# Patient Record
Sex: Female | Born: 1935 | Race: White | Hispanic: No | Smoking: Never smoker
Health system: Southern US, Community
[De-identification: ages and names within clinical notes are randomized; demographics above are authoritative.]

## PROBLEM LIST (undated history)

## (undated) DIAGNOSIS — Z5189 Encounter for other specified aftercare: Secondary | ICD-10-CM

## (undated) DIAGNOSIS — M1990 Unspecified osteoarthritis, unspecified site: Secondary | ICD-10-CM

## (undated) DIAGNOSIS — H3530 Unspecified macular degeneration: Secondary | ICD-10-CM

## (undated) DIAGNOSIS — I10 Essential (primary) hypertension: Secondary | ICD-10-CM

## (undated) DIAGNOSIS — M549 Dorsalgia, unspecified: Secondary | ICD-10-CM

## (undated) DIAGNOSIS — E119 Type 2 diabetes mellitus without complications: Secondary | ICD-10-CM

## (undated) DIAGNOSIS — IMO0002 Reserved for concepts with insufficient information to code with codable children: Secondary | ICD-10-CM

## (undated) DIAGNOSIS — M4800 Spinal stenosis, site unspecified: Secondary | ICD-10-CM

## (undated) DIAGNOSIS — G8929 Other chronic pain: Secondary | ICD-10-CM

## (undated) DIAGNOSIS — N189 Chronic kidney disease, unspecified: Secondary | ICD-10-CM

## (undated) DIAGNOSIS — M329 Systemic lupus erythematosus, unspecified: Secondary | ICD-10-CM

## (undated) DIAGNOSIS — K56609 Unspecified intestinal obstruction, unspecified as to partial versus complete obstruction: Secondary | ICD-10-CM

## (undated) HISTORY — PX: APPENDECTOMY: SHX54

## (undated) HISTORY — PX: TONSILLECTOMY: SUR1361

## (undated) HISTORY — PX: COLOSTOMY: SHX63

## (undated) HISTORY — PX: EYE SURGERY: SHX253

## (undated) HISTORY — PX: BACK SURGERY: SHX140

## (undated) HISTORY — PX: CHOLECYSTECTOMY: SHX55

---

## 2005-02-01 ENCOUNTER — Encounter: Admission: RE | Admit: 2005-02-01 | Discharge: 2005-02-01 | Payer: Self-pay | Admitting: Internal Medicine

## 2005-07-20 ENCOUNTER — Encounter: Admission: RE | Admit: 2005-07-20 | Discharge: 2005-10-18 | Payer: Self-pay | Admitting: Internal Medicine

## 2006-04-11 ENCOUNTER — Ambulatory Visit (HOSPITAL_COMMUNITY): Admission: RE | Admit: 2006-04-11 | Discharge: 2006-04-11 | Payer: Self-pay | Admitting: Internal Medicine

## 2006-05-05 ENCOUNTER — Encounter: Admission: RE | Admit: 2006-05-05 | Discharge: 2006-05-05 | Payer: Self-pay | Admitting: Internal Medicine

## 2008-01-26 ENCOUNTER — Emergency Department (HOSPITAL_COMMUNITY): Admission: EM | Admit: 2008-01-26 | Discharge: 2008-01-26 | Payer: Self-pay | Admitting: Emergency Medicine

## 2008-04-23 ENCOUNTER — Encounter: Admission: RE | Admit: 2008-04-23 | Discharge: 2008-04-23 | Payer: Self-pay | Admitting: Internal Medicine

## 2010-12-19 ENCOUNTER — Encounter: Payer: Self-pay | Admitting: Internal Medicine

## 2011-04-23 ENCOUNTER — Emergency Department (HOSPITAL_BASED_OUTPATIENT_CLINIC_OR_DEPARTMENT_OTHER)
Admission: EM | Admit: 2011-04-23 | Discharge: 2011-04-24 | Disposition: A | Discharge status: Home / Self Care | Payer: Medicare Other | Attending: Emergency Medicine | Admitting: Emergency Medicine

## 2011-04-23 DIAGNOSIS — E119 Type 2 diabetes mellitus without complications: Secondary | ICD-10-CM | POA: Insufficient documentation

## 2011-04-23 DIAGNOSIS — Z79899 Other long term (current) drug therapy: Secondary | ICD-10-CM | POA: Insufficient documentation

## 2011-04-23 DIAGNOSIS — H8110 Benign paroxysmal vertigo, unspecified ear: Secondary | ICD-10-CM | POA: Insufficient documentation

## 2011-04-23 DIAGNOSIS — I10 Essential (primary) hypertension: Secondary | ICD-10-CM | POA: Insufficient documentation

## 2011-08-19 LAB — POCT I-STAT CREATININE: Creatinine, Ser: 1.7 — ABNORMAL HIGH

## 2011-08-19 LAB — DIFFERENTIAL
Basophils Absolute: 0
Basophils Relative: 0
Neutro Abs: 11.5 — ABNORMAL HIGH
Neutrophils Relative %: 96 — ABNORMAL HIGH

## 2011-08-19 LAB — URINE CULTURE: Colony Count: >=100000

## 2011-08-19 LAB — CBC
MCHC: 33.8
RDW: 13.1

## 2011-08-19 LAB — URINALYSIS, ROUTINE W REFLEX MICROSCOPIC
Bilirubin Urine: NEGATIVE
Nitrite: NEGATIVE
Protein, ur: NEGATIVE
Specific Gravity, Urine: 1.025
Urobilinogen, UA: 0.2

## 2011-08-19 LAB — URINE MICROSCOPIC-ADD ON

## 2011-08-19 LAB — I-STAT 8, (EC8 V) (CONVERTED LAB)
Acid-base deficit: 5 — ABNORMAL HIGH
Chloride: 111
Glucose, Bld: 129 — ABNORMAL HIGH
pCO2, Ven: 34.6 — ABNORMAL LOW
pH, Ven: 7.363 — ABNORMAL HIGH

## 2012-03-30 ENCOUNTER — Other Ambulatory Visit: Payer: Self-pay | Admitting: Internal Medicine

## 2012-03-30 DIAGNOSIS — M545 Low back pain: Secondary | ICD-10-CM

## 2012-04-06 ENCOUNTER — Ambulatory Visit
Admission: RE | Admit: 2012-04-06 | Discharge: 2012-04-06 | Disposition: A | Discharge status: Home / Self Care | Payer: PRIVATE HEALTH INSURANCE | Source: Ambulatory Visit | Attending: Internal Medicine | Admitting: Internal Medicine

## 2012-04-06 DIAGNOSIS — M545 Low back pain: Secondary | ICD-10-CM

## 2012-04-10 ENCOUNTER — Other Ambulatory Visit: Payer: PRIVATE HEALTH INSURANCE

## 2013-02-02 ENCOUNTER — Encounter (HOSPITAL_BASED_OUTPATIENT_CLINIC_OR_DEPARTMENT_OTHER): Payer: Self-pay | Admitting: *Deleted

## 2013-02-02 ENCOUNTER — Inpatient Hospital Stay (HOSPITAL_BASED_OUTPATIENT_CLINIC_OR_DEPARTMENT_OTHER)
Admission: EM | Admit: 2013-02-02 | Discharge: 2013-02-07 | DRG: 871 | Disposition: A | Discharge status: Home / Self Care | Payer: Medicare Other | Attending: Internal Medicine | Admitting: Internal Medicine

## 2013-02-02 ENCOUNTER — Emergency Department (HOSPITAL_BASED_OUTPATIENT_CLINIC_OR_DEPARTMENT_OTHER): Payer: Medicare Other

## 2013-02-02 DIAGNOSIS — D72829 Elevated white blood cell count, unspecified: Secondary | ICD-10-CM

## 2013-02-02 DIAGNOSIS — B349 Viral infection, unspecified: Secondary | ICD-10-CM

## 2013-02-02 DIAGNOSIS — R5381 Other malaise: Secondary | ICD-10-CM | POA: Diagnosis present

## 2013-02-02 DIAGNOSIS — I129 Hypertensive chronic kidney disease with stage 1 through stage 4 chronic kidney disease, or unspecified chronic kidney disease: Secondary | ICD-10-CM | POA: Diagnosis present

## 2013-02-02 DIAGNOSIS — D696 Thrombocytopenia, unspecified: Secondary | ICD-10-CM | POA: Diagnosis present

## 2013-02-02 DIAGNOSIS — N183 Chronic kidney disease, stage 3 unspecified: Secondary | ICD-10-CM | POA: Diagnosis present

## 2013-02-02 DIAGNOSIS — E119 Type 2 diabetes mellitus without complications: Secondary | ICD-10-CM | POA: Diagnosis present

## 2013-02-02 DIAGNOSIS — R6521 Severe sepsis with septic shock: Secondary | ICD-10-CM

## 2013-02-02 DIAGNOSIS — I10 Essential (primary) hypertension: Secondary | ICD-10-CM

## 2013-02-02 DIAGNOSIS — R509 Fever, unspecified: Secondary | ICD-10-CM

## 2013-02-02 DIAGNOSIS — I959 Hypotension, unspecified: Secondary | ICD-10-CM

## 2013-02-02 DIAGNOSIS — K5900 Constipation, unspecified: Secondary | ICD-10-CM | POA: Diagnosis present

## 2013-02-02 DIAGNOSIS — R7881 Bacteremia: Secondary | ICD-10-CM | POA: Diagnosis present

## 2013-02-02 DIAGNOSIS — A419 Sepsis, unspecified organism: Principal | ICD-10-CM | POA: Insufficient documentation

## 2013-02-02 DIAGNOSIS — G9340 Encephalopathy, unspecified: Secondary | ICD-10-CM | POA: Diagnosis present

## 2013-02-02 DIAGNOSIS — Z79899 Other long term (current) drug therapy: Secondary | ICD-10-CM

## 2013-02-02 DIAGNOSIS — N390 Urinary tract infection, site not specified: Secondary | ICD-10-CM | POA: Diagnosis present

## 2013-02-02 HISTORY — DX: Type 2 diabetes mellitus without complications: E11.9

## 2013-02-02 HISTORY — DX: Dorsalgia, unspecified: M54.9

## 2013-02-02 HISTORY — DX: Reserved for concepts with insufficient information to code with codable children: IMO0002

## 2013-02-02 HISTORY — DX: Essential (primary) hypertension: I10

## 2013-02-02 HISTORY — DX: Unspecified macular degeneration: H35.30

## 2013-02-02 HISTORY — DX: Encounter for other specified aftercare: Z51.89

## 2013-02-02 HISTORY — DX: Chronic kidney disease, unspecified: N18.9

## 2013-02-02 HISTORY — DX: Other chronic pain: G89.29

## 2013-02-02 HISTORY — DX: Spinal stenosis, site unspecified: M48.00

## 2013-02-02 HISTORY — DX: Systemic lupus erythematosus, unspecified: M32.9

## 2013-02-02 LAB — CBC WITH DIFFERENTIAL/PLATELET
Basophils Absolute: 0 10*3/uL (ref 0.0–0.1)
Eosinophils Absolute: 0 10*3/uL (ref 0.0–0.7)
Eosinophils Relative: 0 % (ref 0–5)
Lymphocytes Relative: 3 % — ABNORMAL LOW (ref 12–46)
Lymphs Abs: 0.6 10*3/uL — ABNORMAL LOW (ref 0.7–4.0)
MCH: 35 pg — ABNORMAL HIGH (ref 26.0–34.0)
MCV: 105 fL — ABNORMAL HIGH (ref 78.0–100.0)
Neutrophils Relative %: 92 % — ABNORMAL HIGH (ref 43–77)
Platelets: 120 10*3/uL — ABNORMAL LOW (ref 150–400)
RBC: 3.2 MIL/uL — ABNORMAL LOW (ref 3.87–5.11)
RDW: 12.6 % (ref 11.5–15.5)
WBC: 17.6 10*3/uL — ABNORMAL HIGH (ref 4.0–10.5)

## 2013-02-02 LAB — URINALYSIS, ROUTINE W REFLEX MICROSCOPIC
Glucose, UA: NEGATIVE mg/dL
Ketones, ur: NEGATIVE mg/dL
Nitrite: NEGATIVE
Specific Gravity, Urine: 1.016 (ref 1.005–1.030)
pH: 6 (ref 5.0–8.0)

## 2013-02-02 LAB — URINE MICROSCOPIC-ADD ON

## 2013-02-02 MED ORDER — SODIUM CHLORIDE 0.9 % IV BOLUS (SEPSIS)
1000.0000 mL | Freq: Once | INTRAVENOUS | Status: AC
  Administered 2013-02-02: 1000 mL via INTRAVENOUS

## 2013-02-02 MED ORDER — DEXTROSE 5 % IV SOLN
1.0000 g | Freq: Once | INTRAVENOUS | Status: AC
  Administered 2013-02-03: 1 g via INTRAVENOUS
  Filled 2013-02-02: qty 10

## 2013-02-02 NOTE — ED Provider Notes (Signed)
History  This chart was scribed for Hilario Quarry, MD, by Candelaria Stagers, ED Scribe. This patient was seen in room MH05/MH05 and the patient's care was started at 10:21 PM   CSN: [id number]  Arrival date & time 02/02/13  2100   First MD Initiated Contact with Patient 02/02/13 2219      Chief Complaint  Patient presents with  . Fatigue  . Chills     The history is provided by the patient and a relative. No language interpreter was used.   Kristen Wallace is a 79 y.o. female who presents to the Emergency Department complaining of fever, chills, and nausea that started earlier today.  Her daughter reports that when she checked on her mother around 7PM she was shivering and experiencing nausea and fever of 101 and reports that earlier today she felt fine.  She denies cough.  Pt reports she has not been eating and drinking normally because she hasn't felt well.   Pt lives alone.  Pt has an ostomy bag in place for bowel blockage.  Her daughter reports she has taken percocet this evening for h/o spinal stenosis.  Pt also has h/o HTN, diabetes, Lupus, and macular degeneration.      Dr. Jamie Kato for Lupus Dr. Dierdre Forth  Dr. Jordan Likes Dr. Allyne Gee for macular degeneration Past Medical History  Diagnosis Date  . Back pain, chronic   . Spinal stenosis   . Blood transfusion without reported diagnosis   . Hypertension   . Diabetes mellitus without complication   . Macular degeneration   . Lupus   . Chronic renal insufficiency     Past Surgical History  Procedure Laterality Date  . Colostomy    . Tonsillectomy    . Appendectomy    . Cholecystectomy    . Eye surgery      No family history on file.  History  Substance Use Topics  . Smoking status: Never Smoker   . Smokeless tobacco: Never Used  . Alcohol Use: No    OB History   Grav Para Term Preterm Abortions TAB SAB Ect Mult Living                  Review of Systems  Constitutional: Positive for fever and chills.  Gastrointestinal:  Positive for nausea and vomiting.  All other systems reviewed and are negative.    Allergies  Betadine  Home Medications   Current Outpatient Rx  Name  Route  Sig  Dispense  Refill  . atenolol (TENORMIN) 25 MG tablet   Oral   Take 12.5 mg by mouth daily.         . Calcium Carbonate Antacid (CALCIUM ANTACID PO)   Oral   Take by mouth.         . co-enzyme Q-10 30 MG capsule   Oral   Take 30 mg by mouth 3 (three) times daily.         Marland Kitchen darifenacin (ENABLEX) 15 MG 24 hr tablet   Oral   Take 15 mg by mouth daily.         . fish oil-omega-3 fatty acids 1000 MG capsule   Oral   Take 2 g by mouth daily.         . hydrochlorothiazide (MICROZIDE) 12.5 MG capsule   Oral   Take 12.5 mg by mouth daily.         . hydroxychloroquine (PLAQUENIL) 200 MG tablet   Oral   Take by mouth daily.         Marland Kitchen  Multiple Vitamin (MULTIVITAMIN) capsule   Oral   Take 1 capsule by mouth daily.         . Multiple Vitamins-Minerals (ICAPS) CAPS   Oral   Take by mouth.         Marland Kitchen omeprazole (PRILOSEC) 20 MG capsule   Oral   Take 20 mg by mouth daily.         . pioglitazone (ACTOS) 30 MG tablet   Oral   Take 30 mg by mouth daily.         Marland Kitchen telmisartan (MICARDIS) 40 MG tablet   Oral   Take 40 mg by mouth daily.           BP 98/55  Pulse 86  Temp(Src) 98.3 F (36.8 C) (Oral)  Resp 20  SpO2 100%  Physical Exam  Nursing note and vitals reviewed. Constitutional: She is oriented to person, place, and time. She appears well-developed and well-nourished. No distress.  HENT:  Head: Normocephalic and atraumatic.  Mucous membranes dry.   Eyes: EOM are normal.  Conjunctiva pale  Neck: Normal range of motion. Neck supple. No tracheal deviation present.  Cardiovascular: Normal rate and regular rhythm.   Pulmonary/Chest: Effort normal. No respiratory distress. She has no wheezes. She has no rales.  Musculoskeletal: Normal range of motion.  Neurological: She is  alert and oriented to person, place, and time.  Skin: Skin is warm and dry. No rash noted.  Psychiatric: She has a normal mood and affect. Her behavior is normal.    ED Course  Procedures   DIAGNOSTIC STUDIES: Oxygen Saturation is 100% on room air, normal by my interpretation.    COORDINATION OF CARE:  10:26 PM Discussed course of care with pt which includes IV fluid, lab work, and chest xray.  Pt understands and agrees.    Labs Reviewed  URINALYSIS, ROUTINE W REFLEX MICROSCOPIC - Abnormal; Notable for the following:    Leukocytes, UA SMALL (*)    All other components within normal limits  URINE MICROSCOPIC-ADD ON - Abnormal; Notable for the following:    Squamous Epithelial / LPF FEW (*)    Bacteria, UA FEW (*)    Casts HYALINE CASTS (*)    All other components within normal limits  URINE CULTURE  CULTURE, BLOOD (ROUTINE X 2)  CULTURE, BLOOD (ROUTINE X 2)  LACTIC ACID, PLASMA  CBC WITH DIFFERENTIAL  COMPREHENSIVE METABOLIC PANEL   Dg Chest 2 View  02/02/2013  *RADIOLOGY REPORT*  Clinical Data: Fever, chills, nausea  CHEST - 2 VIEW  Comparison: Prior chest x-ray 01/26/2008  Findings: No focal airspace consolidation to suggest pneumonia. Mild borderline elevation of the right hemidiaphragm similar to prior.  Chronic interstitial prominence.  Cardiac and mediastinal contours remain within normal limits.  No pleural effusion or pneumothorax.  No acute osseous abnormality.  IMPRESSION: No acute cardiopulmonary disease.  Specifically, negative for pneumonia.   Original Report Authenticated By: Malachy Moan, M.D.      No diagnosis found.    MDM  Elderly female with multiple medical problems including steroid injections of back and on plaquenil with renal insufficiency presents with fever and weakness.  No definite source identified in ed.  Plan admission to [facility] for further evaluation.  Patient with borderline hypotension which responded to fluids and normal  lactate.  1-possible sepsis 2- possible immunocompromise from meds 3 chronic renal insufficiency.  I personally performed the services described in this documentation, which was scribed in my presence. The recorded information has  been reviewed and considered.       Hilario Quarry, MD 02/20/13 1420

## 2013-02-02 NOTE — ED Notes (Signed)
Transported to xray 

## 2013-02-02 NOTE — ED Notes (Signed)
Pt with fever- shaking chills- + nausea, dry heaves in triage

## 2013-02-02 NOTE — ED Notes (Signed)
Pt returned from xray

## 2013-02-03 DIAGNOSIS — D72829 Elevated white blood cell count, unspecified: Secondary | ICD-10-CM

## 2013-02-03 DIAGNOSIS — R509 Fever, unspecified: Secondary | ICD-10-CM

## 2013-02-03 DIAGNOSIS — I959 Hypotension, unspecified: Secondary | ICD-10-CM

## 2013-02-03 LAB — LACTIC ACID, PLASMA: Lactic Acid, Venous: 2.3 mmol/L — ABNORMAL HIGH (ref 0.5–2.2)

## 2013-02-03 LAB — URINE MICROSCOPIC-ADD ON

## 2013-02-03 LAB — URINALYSIS, ROUTINE W REFLEX MICROSCOPIC
Bilirubin Urine: NEGATIVE
Hgb urine dipstick: NEGATIVE
Ketones, ur: NEGATIVE mg/dL
Nitrite: NEGATIVE
Protein, ur: NEGATIVE mg/dL
Specific Gravity, Urine: 1.016 (ref 1.005–1.030)
Urobilinogen, UA: 1 mg/dL (ref 0.0–1.0)

## 2013-02-03 LAB — GLUCOSE, CAPILLARY
Glucose-Capillary: 117 mg/dL — ABNORMAL HIGH (ref 70–99)
Glucose-Capillary: 112 mg/dL — ABNORMAL HIGH (ref 70–99)

## 2013-02-03 LAB — COMPREHENSIVE METABOLIC PANEL
ALT: 21 U/L (ref 0–35)
AST: 45 U/L — ABNORMAL HIGH (ref 0–37)
Alkaline Phosphatase: 63 U/L (ref 39–117)
Calcium: 9.2 mg/dL (ref 8.4–10.5)
Potassium: 4 mEq/L (ref 3.5–5.1)
Sodium: 138 mEq/L (ref 135–145)
Total Protein: 5.8 g/dL — ABNORMAL LOW (ref 6.0–8.3)

## 2013-02-03 MED ORDER — HYDROCODONE-ACETAMINOPHEN 5-325 MG PO TABS
1.0000 | ORAL_TABLET | ORAL | Status: DC | PRN
  Administered 2013-02-03 – 2013-02-04 (×3): 1 via ORAL
  Administered 2013-02-05: 1.5 via ORAL
  Administered 2013-02-05 (×2): 1 via ORAL
  Administered 2013-02-06: 2 via ORAL
  Administered 2013-02-06 – 2013-02-07 (×2): 1 via ORAL
  Filled 2013-02-03 (×3): qty 2
  Filled 2013-02-03 (×3): qty 1
  Filled 2013-02-03: qty 2
  Filled 2013-02-03: qty 1
  Filled 2013-02-03 (×2): qty 2

## 2013-02-03 MED ORDER — OXYCODONE-ACETAMINOPHEN 5-325 MG PO TABS
1.0000 | ORAL_TABLET | Freq: Once | ORAL | Status: AC
  Administered 2013-02-03: 1 via ORAL
  Filled 2013-02-03 (×2): qty 1

## 2013-02-03 MED ORDER — DARIFENACIN HYDROBROMIDE ER 15 MG PO TB24
15.0000 mg | ORAL_TABLET | Freq: Every day | ORAL | Status: DC
  Administered 2013-02-03 – 2013-02-07 (×5): 15 mg via ORAL
  Filled 2013-02-03 (×5): qty 1

## 2013-02-03 MED ORDER — ENOXAPARIN SODIUM 30 MG/0.3ML ~~LOC~~ SOLN: 30.0000 mg | SUBCUTANEOUS | Status: DC

## 2013-02-03 MED ORDER — ATENOLOL 12.5 MG HALF TABLET: 12.5000 mg | ORAL_TABLET | Freq: Every day | ORAL | Status: DC

## 2013-02-03 MED ORDER — HYDROXYCHLOROQUINE SULFATE 200 MG PO TABS
200.0000 mg | ORAL_TABLET | Freq: Once | ORAL | Status: AC
  Administered 2013-02-03: 200 mg via ORAL
  Filled 2013-02-03 (×2): qty 1

## 2013-02-03 MED ORDER — MORPHINE SULFATE 2 MG/ML IJ SOLN: 1.0000 mg | INTRAMUSCULAR | Status: DC | PRN

## 2013-02-03 MED ORDER — VANCOMYCIN HCL 10 G IV SOLR
1250.0000 mg | INTRAVENOUS | Status: DC
  Administered 2013-02-03: 1250 mg via INTRAVENOUS
  Filled 2013-02-03 (×2): qty 1250

## 2013-02-03 MED ORDER — ONDANSETRON HCL 4 MG/2ML IJ SOLN
4.0000 mg | Freq: Once | INTRAMUSCULAR | Status: AC
  Administered 2013-02-03: 4 mg via INTRAVENOUS
  Filled 2013-02-03: qty 2

## 2013-02-03 MED ORDER — ONDANSETRON HCL 4 MG/2ML IJ SOLN: 4.0000 mg | Freq: Once | INTRAMUSCULAR | Status: DC

## 2013-02-03 MED ORDER — HYDROCHLOROTHIAZIDE 12.5 MG PO CAPS
12.5000 mg | ORAL_CAPSULE | Freq: Every day | ORAL | Status: DC
  Filled 2013-02-03: qty 1

## 2013-02-03 MED ORDER — DEXTROSE 5 % IV SOLN
1.0000 g | INTRAVENOUS | Status: DC
  Administered 2013-02-03: 1 g via INTRAVENOUS
  Filled 2013-02-03: qty 10

## 2013-02-03 MED ORDER — OXYCODONE-ACETAMINOPHEN 5-325 MG PO TABS
1.0000 | ORAL_TABLET | Freq: Once | ORAL | Status: AC
  Filled 2013-02-03: qty 1

## 2013-02-03 MED ORDER — SODIUM CHLORIDE 0.9 % IV SOLN
Freq: Once | INTRAVENOUS | Status: AC
  Administered 2013-02-03: 01:00:00 via INTRAVENOUS

## 2013-02-03 MED ORDER — PANTOPRAZOLE SODIUM 40 MG PO TBEC
40.0000 mg | DELAYED_RELEASE_TABLET | Freq: Every day | ORAL | Status: DC
  Administered 2013-02-03 – 2013-02-07 (×5): 40 mg via ORAL
  Filled 2013-02-03 (×6): qty 1

## 2013-02-03 MED ORDER — PIPERACILLIN-TAZOBACTAM 3.375 G IVPB
3.3750 g | Freq: Three times a day (TID) | INTRAVENOUS | Status: DC
  Administered 2013-02-03 – 2013-02-06 (×8): 3.375 g via INTRAVENOUS
  Filled 2013-02-03 (×10): qty 50

## 2013-02-03 MED ORDER — INSULIN ASPART 100 UNIT/ML ~~LOC~~ SOLN: 0.0000 [IU] | Freq: Three times a day (TID) | SUBCUTANEOUS | Status: DC

## 2013-02-03 MED ORDER — SODIUM CHLORIDE 0.9 % IV SOLN
INTRAVENOUS | Status: DC
  Administered 2013-02-03 – 2013-02-05 (×4): via INTRAVENOUS

## 2013-02-03 MED ORDER — OMEGA-3-ACID ETHYL ESTERS 1 G PO CAPS
2.0000 g | ORAL_CAPSULE | Freq: Every day | ORAL | Status: DC
  Administered 2013-02-03 – 2013-02-07 (×5): 2 g via ORAL
  Filled 2013-02-03 (×5): qty 2

## 2013-02-03 MED ORDER — OXYCODONE-ACETAMINOPHEN 5-325 MG PO TABS
ORAL_TABLET | ORAL | Status: AC
  Administered 2013-02-03: 1 via ORAL
  Filled 2013-02-03: qty 1

## 2013-02-03 MED ORDER — DOCUSATE SODIUM 100 MG PO CAPS
100.0000 mg | ORAL_CAPSULE | Freq: Two times a day (BID) | ORAL | Status: DC
  Administered 2013-02-04 – 2013-02-07 (×6): 100 mg via ORAL
  Filled 2013-02-03 (×10): qty 1

## 2013-02-03 MED ORDER — ACETAMINOPHEN 325 MG PO TABS
650.0000 mg | ORAL_TABLET | Freq: Four times a day (QID) | ORAL | Status: DC | PRN
  Filled 2013-02-03 (×2): qty 2

## 2013-02-03 MED ORDER — SODIUM CHLORIDE 0.9 % IJ SOLN
3.0000 mL | Freq: Two times a day (BID) | INTRAMUSCULAR | Status: DC
  Administered 2013-02-03 – 2013-02-07 (×5): 3 mL via INTRAVENOUS

## 2013-02-03 MED ORDER — OMEGA-3 FATTY ACIDS 1000 MG PO CAPS
2.0000 g | ORAL_CAPSULE | Freq: Every day | ORAL | Status: DC
Start: 2013-02-03 — End: 2013-02-03

## 2013-02-03 MED ORDER — SODIUM CHLORIDE 0.9 % IV SOLN: INTRAVENOUS | Status: DC

## 2013-02-03 MED ORDER — HYDROXYCHLOROQUINE SULFATE 200 MG PO TABS
200.0000 mg | ORAL_TABLET | Freq: Every day | ORAL | Status: DC
  Administered 2013-02-04 – 2013-02-07 (×4): 200 mg via ORAL
  Filled 2013-02-03 (×4): qty 1

## 2013-02-03 MED ORDER — ACETAMINOPHEN 650 MG RE SUPP: 650.0000 mg | Freq: Four times a day (QID) | RECTAL | Status: DC | PRN

## 2013-02-03 NOTE — ED Notes (Signed)
Pt's daughter is upset that transportation has not arrived.   She was upset and stated she the patient has been waiting for a bed since 2 am.  Called [facility] regarding status, informed of delay.  [facility] called for facilitation of transportation.

## 2013-02-03 NOTE — ED Notes (Signed)
Pt c/o chronic lower back pain, requesting pain medications. MD aware

## 2013-02-03 NOTE — Progress Notes (Signed)
Patient's daughter refused both flu and pneumonia vaccines as per advised by their primary md.

## 2013-02-03 NOTE — Progress Notes (Signed)
Patient's both aerobic and unaerobic blood culture is positive with gram negative rods macular degeneration on call made aware.

## 2013-02-03 NOTE — ED Notes (Signed)
[facility] here to transport pt. 

## 2013-02-03 NOTE — Progress Notes (Addendum)
ANTIBIOTIC CONSULT NOTE - INITIAL  Pharmacy Consult for vancomycin and zosyn Indication: empiric treatment   Allergies  Allergen Reactions  . Betadine (Povidone Iodine)     Patient Measurements: Weight: 194 lb (87.998 kg)   Vital Signs: Temp: 98.1 F (36.7 C) (03/09 1202) Temp src: Oral (03/09 1202) BP: 95/55 mmHg (03/09 1202) Pulse Rate: 62 (03/09 1202) Intake/Output from previous day:   Intake/Output from this shift:    Labs:  Recent Labs  02/02/13 2323  WBC 17.6*  HGB 11.2*  PLT 120*  CREATININE 1.40*   CrCl is unknown because there is no height on file for the current visit. No results found for this basename: VANCOTROUGH, VANCOPEAK, VANCORANDOM, GENTTROUGH, GENTPEAK, GENTRANDOM, TOBRATROUGH, TOBRAPEAK, TOBRARND, AMIKACINPEAK, AMIKACINTROU, AMIKACIN,  in the last 72 hours   Microbiology: No results found for this or any previous visit (from the past 720 hour(s)).  Medical History: Past Medical History  Diagnosis Date  . Back pain, chronic   . Spinal stenosis   . Blood transfusion without reported diagnosis   . Hypertension   . Diabetes mellitus without complication   . Macular degeneration   . Lupus   . Chronic renal insufficiency     Medications:  Prescriptions prior to admission  Medication Sig Dispense Refill  . atenolol (TENORMIN) 25 MG tablet Take 12.5 mg by mouth daily.      . Calcium Carbonate Antacid (CALCIUM ANTACID PO) Take by mouth.      . co-enzyme Q-10 30 MG capsule Take 30 mg by mouth 3 (three) times daily.      Marland Kitchen darifenacin (ENABLEX) 15 MG 24 hr tablet Take 15 mg by mouth daily.      . fish oil-omega-3 fatty acids 1000 MG capsule Take 2 g by mouth daily.      . hydrochlorothiazide (MICROZIDE) 12.5 MG capsule Take 12.5 mg by mouth daily.      . hydroxychloroquine (PLAQUENIL) 200 MG tablet Take by mouth daily.      . Multiple Vitamin (MULTIVITAMIN) capsule Take 1 capsule by mouth daily.      . Multiple Vitamins-Minerals (ICAPS) CAPS  Take by mouth.      Marland Kitchen omeprazole (PRILOSEC) 20 MG capsule Take 20 mg by mouth daily.      . pioglitazone (ACTOS) 30 MG tablet Take 30 mg by mouth daily.      Marland Kitchen telmisartan (MICARDIS) 40 MG tablet Take 40 mg by mouth daily.       Assessment: 79 yo F tx from [facility]. To start empiric antibiotics.  Noted to have CRI.  CrCl ~35 ml/min. Received ceftriaxone 1g Iv x1 overnight.  No other antibiotics noted.  Goal of Therapy:  Vancomycin trough level 15-20 mcg/ml  Plan:  - Vancomycin 1250 mg IV q24h - Follow up SCr, UOP, cultures, clinical course and adjust as clinically indicated.   Alison L. Illene Bolus, PharmD, BCPS Clinical Pharmacist Pager: [phone] Pharmacy: [phone] 02/03/2013 1:41 PM   Addendum - pharmacy also asked to begin Zosyn for blood and urine Cx growing GNR.  Will give Zosyn 3.375g IV q 8 hrs (extended infusion).  Tad Moore, BCPS  Clinical Pharmacist Pager [phone]  02/03/2013 6:01 PM

## 2013-02-03 NOTE — Progress Notes (Signed)
Patient's daughter refused both flue shot and pneumonia vaccine for medical reasons as per their primary doctor advised.

## 2013-02-03 NOTE — ED Notes (Signed)
MD at bedside. 

## 2013-02-03 NOTE — Progress Notes (Signed)
Bladder scan done,result 101 ml.

## 2013-02-03 NOTE — ED Notes (Signed)
pts daughter Cherly Hensen [phone]

## 2013-02-03 NOTE — H&P (Signed)
[facility] History and Physical  Serrita Lueth HQI:[id number] DOB: [birth year] DOA: 02/02/2013  Referring physician: Ed Physician.  PCP: No primary provider on file.  Specialists: None.   Chief Complaint: Chills.   HPI: Kristen Wallace is a 79 y.o. female with PMH significant for Diabetes, Colostomy, Spinal stenosis, steroids injections spine, Lupus, Hypertension who was transfer from [facility]. Patient presented to ED with history of fever started day prior to admission. Tempeture measure at home was 101.7. Patient was very lethargic, sluggish, sleepy per daughter report. Daughter notice patient urine was very dark. Patient relate history of urinary frequency, denies dysuria. She denies worsening of her chronic back pain. Last steroids injection was December 2013 and January 2014. He back pain usually radiates to her left leg. Patient denies cough, diarrhea, vomiting, nausea. She has history of macular degeneration. She received medication  Injection by her ophthalmologist. No eye problems.   Review of Systems: The patient denies weight loss, chest pain, syncope, dyspnea on exertion, peripheral edema, balance deficits, hemoptysis, abdominal pain, melena, hematochezia, severe indigestion/heartburn, hematuria, incontinence, genital sores, muscle weakness, suspicious skin lesions,  , abnormal bleeding.  Past Medical History  Diagnosis Date  . Back pain, chronic   . Spinal stenosis   . Blood transfusion without reported diagnosis   . Hypertension   . Diabetes mellitus without complication   . Macular degeneration   . Lupus   . Chronic renal insufficiency       Past Surgical History  Procedure Laterality Date  . Colostomy    . Tonsillectomy    . Appendectomy    . Cholecystectomy    . Eye surgery     Social History:  reports that she has never smoked. She has never used smokeless tobacco. She reports that she does not drink alcohol or use illicit drugs. Patient lives at home by herself. She has  very good family support. She is able to do daily living activities.   Allergies  Allergen Reactions  . Betadine (Povidone Iodine)    Family History: Mother had Hearth Diseases. Father died Lung cancer.   Prior to Admission medications   Medication Sig Start Date End Date Taking? Authorizing Provider  atenolol (TENORMIN) 25 MG tablet Take 12.5 mg by mouth daily.   Yes Historical Provider, MD  Calcium Carbonate Antacid (CALCIUM ANTACID PO) Take by mouth.   Yes Historical Provider, MD  co-enzyme Q-10 30 MG capsule Take 30 mg by mouth 3 (three) times daily.   Yes Historical Provider, MD  darifenacin (ENABLEX) 15 MG 24 hr tablet Take 15 mg by mouth daily.   Yes Historical Provider, MD  fish oil-omega-3 fatty acids 1000 MG capsule Take 2 g by mouth daily.   Yes Historical Provider, MD  hydrochlorothiazide (MICROZIDE) 12.5 MG capsule Take 12.5 mg by mouth daily.   Yes Historical Provider, MD  hydroxychloroquine (PLAQUENIL) 200 MG tablet Take by mouth daily.   Yes Historical Provider, MD  Multiple Vitamin (MULTIVITAMIN) capsule Take 1 capsule by mouth daily.   Yes Historical Provider, MD  Multiple Vitamins-Minerals (ICAPS) CAPS Take by mouth.   Yes Historical Provider, MD  omeprazole (PRILOSEC) 20 MG capsule Take 20 mg by mouth daily.   Yes Historical Provider, MD  pioglitazone (ACTOS) 30 MG tablet Take 30 mg by mouth daily.   Yes Historical Provider, MD  telmisartan (MICARDIS) 40 MG tablet Take 40 mg by mouth daily.   Yes Historical Provider, MD   Physical Exam: Filed Vitals:   02/03/13 0830 02/03/13 1324  02/03/13 0930 02/03/13 1202  BP: 110/30 110/30 108/39 95/55  Pulse: 65 69 71 62  Temp:    98.1 F (36.7 C)  TempSrc:    Oral  Resp:  16  16  Weight:    87.998 kg (194 lb)  SpO2: 94% 95% 98% 97%    Labs on Admission:  Basic Metabolic Panel:  Recent Labs Lab 02/02/13 2323  NA 138  K 4.0  CL 100  CO2 26  GLUCOSE 127*  BUN 31*  CREATININE 1.40*  CALCIUM 9.2   Liver Function  Tests:  Recent Labs Lab 02/02/13 2323  AST 45*  ALT 21  ALKPHOS 63  BILITOT 0.8  PROT 5.8*  ALBUMIN 3.0*   CBC:  Recent Labs Lab 02/02/13 2323  WBC 17.6*  NEUTROABS 16.1*  HGB 11.2*  HCT 33.6*  MCV 105.0*  PLT 120*   Cardiac Enzymes: No results found for this basename: CKTOTAL, CKMB, CKMBINDEX, TROPONINI,  in the last 168 hours  BNP (last 3 results) No results found for this basename: PROBNP,  in the last 8760 hours CBG:  Recent Labs Lab 02/03/13 1159  GLUCAP 117*    Radiological Exams on Admission: Dg Chest 2 View  02/02/2013  *RADIOLOGY REPORT*  Clinical Data: Fever, chills, nausea  CHEST - 2 VIEW  Comparison: Prior chest x-ray 01/26/2008  Findings: No focal airspace consolidation to suggest pneumonia. Mild borderline elevation of the right hemidiaphragm similar to prior.  Chronic interstitial prominence.  Cardiac and mediastinal contours remain within normal limits.  No pleural effusion or pneumothorax.  No acute osseous abnormality.  IMPRESSION: No acute cardiopulmonary disease.  Specifically, negative for pneumonia.   Original Report Authenticated By: Malachy Moan, M.D.     EKG: Independently reviewed.   Assessment/Plan Active Problems:   * No active hospital problems. *  1-Fever: UA no significant WBC. Chest x ray negative. Blood culture ordered. Will follow urine culture. Due to history of back pain, and steroids injections I will get MRI Lumbar spine rule out diskitis. Patient and daughter  Want MRI without contrast. No diarrhea, no cough, no sore throat, no runny nose.  2-Hypotension: Probably decrease volume. Initial lactic acid at 2.3 not significant elevated. Will monitor closely for sepsis. IV fluids. Will repeat Lactic acid. I will hold BP medication. 3-Diabetes: Hold Actos, patient with decrease appetite. SSI.  4-Hypertension: Hold atenolol, HCTZ, Micardis due to hypotension. 5-Lupus; Continue with Plaquenil for now.  6-CKD stage stage 3: Per  daughter last Cr at 1.3 to 1.4. She probably has acute renal insufficiency in setting infection, dehydration. IV fluids. Repeat B-met in am.  7-Transaminases: Very mild increase AST. Repeat in am.  8-Thrombocytopenia: Probably related to infection. Monitor. SCD for DVT prophylaxis.  9-Encephalopathy: Probably related to fever, infection. Patient now back to baseline.     Code Status: Full Code.  Family Communication: Plan of care discussed with Daughter who was at bedside. Disposition Plan: Expect 3 to 4 days inpatient.   Time spent: 70 minutes.   Gokul Waybright [facility] Pager [phone]  If 7PM-7AM, please contact night-coverage [email or web address] Password [facility] 02/03/2013, 12:46 PM

## 2013-02-03 NOTE — ED Notes (Signed)
Pt vomited X1. Will medicate for nausea

## 2013-02-04 ENCOUNTER — Inpatient Hospital Stay (HOSPITAL_COMMUNITY): Payer: Medicare Other

## 2013-02-04 ENCOUNTER — Encounter (HOSPITAL_COMMUNITY): Payer: Self-pay | Admitting: *Deleted

## 2013-02-04 DIAGNOSIS — A419 Sepsis, unspecified organism: Secondary | ICD-10-CM | POA: Insufficient documentation

## 2013-02-04 DIAGNOSIS — R7881 Bacteremia: Secondary | ICD-10-CM | POA: Diagnosis present

## 2013-02-04 DIAGNOSIS — R6520 Severe sepsis without septic shock: Secondary | ICD-10-CM

## 2013-02-04 LAB — CBC
MCHC: 34.4 g/dL (ref 30.0–36.0)
MCV: 102.6 fL — ABNORMAL HIGH (ref 78.0–100.0)
Platelets: 87 10*3/uL — ABNORMAL LOW (ref 150–400)
RDW: 13.1 % (ref 11.5–15.5)
WBC: 9.8 10*3/uL (ref 4.0–10.5)

## 2013-02-04 LAB — COMPREHENSIVE METABOLIC PANEL
ALT: 20 U/L (ref 0–35)
Alkaline Phosphatase: 68 U/L (ref 39–117)
BUN: 33 mg/dL — ABNORMAL HIGH (ref 6–23)
Chloride: 100 mEq/L (ref 96–112)
GFR calc Af Amer: 37 mL/min — ABNORMAL LOW (ref >90)
Glucose, Bld: 87 mg/dL (ref 70–99)
Potassium: 3.8 mEq/L (ref 3.5–5.1)
Total Bilirubin: 0.5 mg/dL (ref 0.3–1.2)

## 2013-02-04 LAB — URINE CULTURE: Colony Count: 15000

## 2013-02-04 LAB — GLUCOSE, CAPILLARY
Glucose-Capillary: 118 mg/dL — ABNORMAL HIGH (ref 70–99)
Glucose-Capillary: 124 mg/dL — ABNORMAL HIGH (ref 70–99)
Glucose-Capillary: 141 mg/dL — ABNORMAL HIGH (ref 70–99)

## 2013-02-04 MED ORDER — BIOTENE DRY MOUTH MT LIQD
15.0000 mL | Freq: Two times a day (BID) | OROMUCOSAL | Status: DC
  Administered 2013-02-04 – 2013-02-07 (×6): 15 mL via OROMUCOSAL

## 2013-02-04 NOTE — Progress Notes (Signed)
Utilization review completed.  

## 2013-02-04 NOTE — Progress Notes (Addendum)
[facility] PROGRESS NOTE Interim History: 79 y.o. female with PMH significant for Diabetes, Colostomy, Spinal stenosis, steroids injections spine, Lupus, Hypertension who was transfer from [facility]. Patient presented to ED with history of fever started day prior to admission. Tempeture measure at home was 101.7. Patient was very lethargic, sluggish, sleepy per daughter report. Daughter notice patient urine was very dark. Patient relate history of urinary frequency, denies dysuria. She denies worsening of her chronic back pain. Last steroids injection was December 2013 and January 2014    Assessment/Plan: Fever and chills due to Septic shock/bacteremia: - No fevers overnight. WBC trending down. - Vanc. zosyn 3.9.2014. D/c Zenaida Niece.c on 3.10.2014 - 2/2 BC + gram negative rods, awaiting sensitivities ans speciation.   Leukocytosis: - resolved.  Hypotension, unspecified - Bp stable, now. Responded to - no pressors required.   Code Status: Full Code.  Family Communication: Plan of care discussed with Daughter who was at bedside.  Disposition Plan: Expect 3 to 4 days inpatient.    Consultants:  none  Procedures: MRI 3.10.2014: Severe multifactorial spinal stenosis at L4-5 likely to be  symptomatic. This may have worsened minimally since the previous  study.  Moderate to severe multifactorial stenosis at L3-4 that could be  symptomatic. This has worsened minimally since the previous study.  Moderate multifactorial stenosis at L2-3 but without definite focal  neural compression.  Involution of a left posterolateral disc herniation at L5-S1 since  the previous study. No definite neural compression presently at  this level.  No changes in the marrow foci within the L1 and L2 vertebral  bodies. This indicates a benign nature.    Antibiotics:  zosyn  HPI/Subjective: Feels better Having urgency  Objective: Filed Vitals:   02/03/13 1202 02/03/13 1548 02/03/13 2017 02/04/13  0613  BP: 95/55 110/48 118/45 149/61  Pulse: 62 68 76 73  Temp: 98.1 F (36.7 C) 97.9 F (36.6 C) 98.2 F (36.8 C) 97.7 F (36.5 C)  TempSrc: Oral Oral Oral Oral  Resp: 16 18 17 16   Weight: 87.998 kg (194 lb)  89.495 kg (197 lb 4.8 oz)   SpO2: 97% 99% 99% 99%    Intake/Output Summary (Last 24 hours) at 02/04/13 0757 Last data filed at 02/03/13 1123  Gross per 24 hour  Intake      0 ml  Output    200 ml  Net   -200 ml   Filed Weights   02/03/13 1202 02/03/13 2017  Weight: 87.998 kg (194 lb) 89.495 kg (197 lb 4.8 oz)    Exam:  General: Alert, awake, oriented x3, in no acute distress.  HEENT: No bruits, no goiter.  Heart: Regular rate and rhythm, without murmurs, rubs, gallops.  Lungs: Good air movement, bilateral air movement.  Abdomen: Soft, nontender, stoma in not tender. Neuro: Grossly intact, nonfocal.   Data Reviewed: Basic Metabolic Panel:  Recent Labs Lab 02/02/13 2323 02/04/13 0529  NA 138 133*  K 4.0 3.8  CL 100 100  CO2 26 24  GLUCOSE 127* 87  BUN 31* 33*  CREATININE 1.40* 1.47*  CALCIUM 9.2 7.9*   Liver Function Tests:  Recent Labs Lab 02/02/13 2323 02/04/13 0529  AST 45* 32  ALT 21 20  ALKPHOS 63 68  BILITOT 0.8 0.5  PROT 5.8* 5.2*  ALBUMIN 3.0* 2.5*   No results found for this basename: LIPASE, AMYLASE,  in the last 168 hours No results found for this basename: AMMONIA,  in the last 168 hours CBC:  Recent  Labs Lab 02/02/13 2323 02/04/13 0529  WBC 17.6* 9.8  NEUTROABS 16.1*  --   HGB 11.2* 10.8*  HCT 33.6* 31.4*  MCV 105.0* 102.6*  PLT 120* 87*   Cardiac Enzymes: No results found for this basename: CKTOTAL, CKMB, CKMBINDEX, TROPONINI,  in the last 168 hours BNP (last 3 results) No results found for this basename: PROBNP,  in the last 8760 hours CBG:  Recent Labs Lab 02/03/13 1159 02/03/13 1739 02/03/13 1947  GLUCAP 117* 114* 112*    Recent Results (from the past 240 hour(s))  CULTURE, BLOOD (ROUTINE X 2)      Status: None   Collection Time    02/02/13 11:20 PM      Result Value Range Status   Specimen Description BLOOD RIGHT THUMB   Final   Special Requests BOTTLES DRAWN AEROBIC ONLY 5cc   Final   Culture  Setup Time 02/03/2013 06:37   Final   Culture     Final   Value: GRAM NEGATIVE RODS     Note: Gram Stain Report Called to,Read Back By and Verified With: ALBERT RIO 02/03/13 @ 6:31PM BY RUSCA.   Report Status PENDING   Incomplete  CULTURE, BLOOD (ROUTINE X 2)     Status: None   Collection Time    02/03/13 12:10 AM      Result Value Range Status   Specimen Description BLOOD LEFT ARM   Final   Special Requests     Final   Value: BOTTLES DRAWN AEROBIC AND ANAEROBIC AER 5cc ANA 5cc   Culture  Setup Time 02/03/2013 06:36   Final   Culture     Final   Value: GRAM NEGATIVE RODS     Note: Gram Stain Report Called to,Read Back By and Verified With: ALBERT RIO 02/03/13 @ 5:26PM BY RUSCA.   Report Status PENDING   Incomplete     Studies: Dg Chest 2 View  02/02/2013  *RADIOLOGY REPORT*  Clinical Data: Fever, chills, nausea  CHEST - 2 VIEW  Comparison: Prior chest x-ray 01/26/2008  Findings: No focal airspace consolidation to suggest pneumonia. Mild borderline elevation of the right hemidiaphragm similar to prior.  Chronic interstitial prominence.  Cardiac and mediastinal contours remain within normal limits.  No pleural effusion or pneumothorax.  No acute osseous abnormality.  IMPRESSION: No acute cardiopulmonary disease.  Specifically, negative for pneumonia.   Original Report Authenticated By: Malachy Moan, M.D.     Scheduled Meds: . antiseptic oral rinse  15 mL Mouth Rinse BID  . darifenacin  15 mg Oral Daily  . docusate sodium  100 mg Oral BID  . hydroxychloroquine  200 mg Oral Daily  . insulin aspart  0-9 Units Subcutaneous TID WC  . omega-3 acid ethyl esters  2 g Oral Daily  . pantoprazole  40 mg Oral Daily  . piperacillin-tazobactam (ZOSYN)  IV  3.375 g Intravenous Q8H  . sodium  chloride  3 mL Intravenous Q12H  . vancomycin  1,250 mg Intravenous Q24H   Continuous Infusions: . sodium chloride 100 mL/hr at 02/03/13 1123     FELIZ Rosine Beat  [facility] Pager [phone]. If 8PM-8AM, please contact night-coverage at [email or web address], password [facility] 02/04/2013, 7:57 AM  LOS: 2 days

## 2013-02-05 LAB — GLUCOSE, CAPILLARY: Glucose-Capillary: 136 mg/dL — ABNORMAL HIGH (ref 70–99)

## 2013-02-05 NOTE — Progress Notes (Signed)
[facility] PROGRESS NOTE Interim History: 79 y.o. female with PMH significant for Diabetes, Colostomy, Spinal stenosis, steroids injections spine, Lupus, Hypertension who was transfer from [facility]. Patient presented to ED with history of fever started day prior to admission. Tempeture measure at home was 101.7. Patient was very lethargic, sluggish, sleepy per daughter report. Daughter notice patient urine was very dark. Patient relate history of urinary frequency, denies dysuria. She denies worsening of her chronic back pain. Last steroids injection was December 2013 and January 2014    Assessment/Plan: Fever and chills due to Septic shock/bacteremia: - No fevers overnight.  - Zosyn 3.9.2014.  - 2/2 BC + gram negative rods, awaiting sensitivities ans speciation. And S. Agalactiae in urine.   Leukocytosis: - resolved.  Hypotension, unspecified - Bp stable, now. Responded to - no pressors required.   Code Status: Full Code.  Family Communication: Plan of care discussed with Daughter who was at bedside.  Disposition Plan: Expect 3 to 4 days inpatient.    Consultants:  none  Procedures: MRI 3.10.2014: Severe multifactorial spinal stenosis at L4-5 likely to be  symptomatic. This may have worsened minimally since the previous  study.  Moderate to severe multifactorial stenosis at L3-4 that could be  symptomatic. This has worsened minimally since the previous study.  Moderate multifactorial stenosis at L2-3 but without definite focal  neural compression.  Involution of a left posterolateral disc herniation at L5-S1 since  the previous study. No definite neural compression presently at  this level.  No changes in the marrow foci within the L1 and L2 vertebral  bodies. This indicates a benign nature.    Antibiotics:  zosyn  HPI/Subjective: Feels better   Objective: Filed Vitals:   02/04/13 1810 02/04/13 2000 02/04/13 2114 02/05/13 0544  BP: 131/55  115/40 142/47   Pulse: 77  79 88  Temp: 97.6 F (36.4 C)  98.6 F (37 C) 99.7 F (37.6 C)  TempSrc: Oral  Oral Oral  Resp: 18  18 18   Height:  5\' 6"  (1.676 m) 5\' 6"  (1.676 m)   Weight:   89.495 kg (197 lb 4.8 oz)   SpO2: 97%  100% 99%    Intake/Output Summary (Last 24 hours) at 02/05/13 0747 Last data filed at 02/05/13 0609  Gross per 24 hour  Intake 3476.67 ml  Output   1900 ml  Net 1576.67 ml   Filed Weights   02/03/13 1202 02/03/13 2017 02/04/13 2114  Weight: 87.998 kg (194 lb) 89.495 kg (197 lb 4.8 oz) 89.495 kg (197 lb 4.8 oz)    Exam:  General: Alert, awake, oriented x3, in no acute distress.  HEENT: No bruits, no goiter.  Heart: Regular rate and rhythm, without murmurs, rubs, gallops.  Lungs: Good air movement, bilateral air movement.  Abdomen: Soft, nontender, stoma in not tender. Neuro: Grossly intact, nonfocal.   Data Reviewed: Basic Metabolic Panel:  Recent Labs Lab 02/02/13 2323 02/04/13 0529  NA 138 133*  K 4.0 3.8  CL 100 100  CO2 26 24  GLUCOSE 127* 87  BUN 31* 33*  CREATININE 1.40* 1.47*  CALCIUM 9.2 7.9*   Liver Function Tests:  Recent Labs Lab 02/02/13 2323 02/04/13 0529  AST 45* 32  ALT 21 20  ALKPHOS 63 68  BILITOT 0.8 0.5  PROT 5.8* 5.2*  ALBUMIN 3.0* 2.5*   No results found for this basename: LIPASE, AMYLASE,  in the last 168 hours No results found for this basename: AMMONIA,  in the last 168  hours CBC:  Recent Labs Lab 02/02/13 2323 02/04/13 0529  WBC 17.6* 9.8  NEUTROABS 16.1*  --   HGB 11.2* 10.8*  HCT 33.6* 31.4*  MCV 105.0* 102.6*  PLT 120* 87*   Cardiac Enzymes: No results found for this basename: CKTOTAL, CKMB, CKMBINDEX, TROPONINI,  in the last 168 hours BNP (last 3 results) No results found for this basename: PROBNP,  in the last 8760 hours CBG:  Recent Labs Lab 02/03/13 1739 02/03/13 1947 02/04/13 1143 02/04/13 1748 02/04/13 2117  GLUCAP 114* 112* 118* 124* 141*    Recent Results (from the past 240  hour(s))  URINE CULTURE     Status: None   Collection Time    02/02/13  9:56 PM      Result Value Range Status   Specimen Description URINE, CLEAN CATCH   Final   Special Requests NONE   Final   Culture  Setup Time 02/03/2013 06:38   Final   Colony Count 15,000 COLONIES/ML   Final   Culture     Final   Value: GROUP B STREP(S.AGALACTIAE)ISOLATED     Note: TESTING AGAINST S. AGALACTIAE NOT ROUTINELY PERFORMED DUE TO PREDICTABILITY OF AMP/PEN/VAN SUSCEPTIBILITY.   Report Status 02/04/2013 FINAL   Final  CULTURE, BLOOD (ROUTINE X 2)     Status: None   Collection Time    02/02/13 11:20 PM      Result Value Range Status   Specimen Description BLOOD RIGHT THUMB   Final   Special Requests BOTTLES DRAWN AEROBIC ONLY 5cc   Final   Culture  Setup Time 02/03/2013 06:37   Final   Culture     Final   Value: GRAM NEGATIVE RODS     Note: Gram Stain Report Called to,Read Back By and Verified With: ALBERT RIO 02/03/13 @ 6:31PM BY RUSCA.   Report Status PENDING   Incomplete  CULTURE, BLOOD (ROUTINE X 2)     Status: None   Collection Time    02/03/13 12:10 AM      Result Value Range Status   Specimen Description BLOOD LEFT ARM   Final   Special Requests     Final   Value: BOTTLES DRAWN AEROBIC AND ANAEROBIC AER 5cc ANA 5cc   Culture  Setup Time 02/03/2013 06:36   Final   Culture     Final   Value: GRAM NEGATIVE RODS     Note: Gram Stain Report Called to,Read Back By and Verified With: ALBERT RIO 02/03/13 @ 5:26PM BY RUSCA.   Report Status PENDING   Incomplete     Studies: Mr Lumbar Spine Wo Contrast  02/04/2013  *RADIOLOGY REPORT*  Clinical Data: Low back pain radiating to both legs.  Fever.  UTI.  MRI LUMBAR SPINE WITHOUT CONTRAST  Technique:  Multiplanar and multiecho pulse sequences of the lumbar spine were obtained without intravenous contrast.  Comparison: 04/06/2012  Findings: Previously described foci of low T1 marrow signal within the L1 and L2 vertebral bodies have not changed in the last 10  months and cannot be conclusively described as benign foci.  There are no progressive or worrisome marrow space lesions.  There is mild curvature convex to the left at the apex at L2-3. The distal cord and conus are normal with conus tip at lower L1.  L1-2:  Mild bulging of the disc.  Mild facet and ligamentous hypertrophy.  Mild stenosis of the canal but without apparent neural compression.  L2-3:  Shallow circumferential protrusion of the disc.  Facet and ligamentous hypertrophy right worse than left.  Moderate multifactorial stenosis with AP diameter of the thecal sac measuring only 6 mm.  No definite focal neural compression however.  L3-4:  Circumferential protrusion of disc material.  Facet and ligamentous hypertrophy.  Severe multifactorial stenosis with AP diameter of the thecal sac measuring only 5 mm.  Neural compression could occur at this level.  L4-5:  Advanced bilateral facet arthropathy allowing anterolisthesis of 4 mm.  Circumferential protrusion of disc material.  Facet and ligamentous hypertrophy.  Very severe multifactorial spinal stenosis with a markedly constricted thecal sac.  L5-S1:  Bulging of the disc. Facet and ligamentous hypertrophy. Narrowing of the subarticular lateral recesses right more than left but no definite neural compression. On the previous study, there was herniated disc material towards the left that could have cause neural compression, but seems to have involuted on today's study.  Compared to the study of last May, findings are very similar, possibly minimally progressive at L3-4 and L4-5. Involution of a left posterolateral disc herniation at L5-S1.  IMPRESSION: Severe multifactorial spinal stenosis at L4-5 likely to be symptomatic.  This may have worsened minimally since the previous study.  Moderate to severe multifactorial stenosis at L3-4 that could be symptomatic.  This has worsened minimally since the previous study.  Moderate multifactorial stenosis at L2-3 but  without definite focal neural compression.  Involution of a left posterolateral disc herniation at L5-S1 since the previous study.  No definite neural compression presently at this level.  No changes in the marrow foci within the L1 and L2 vertebral bodies.  This indicates a benign nature.   Original Report Authenticated By: Paulina Fusi, M.D.     Scheduled Meds: . antiseptic oral rinse  15 mL Mouth Rinse BID  . darifenacin  15 mg Oral Daily  . docusate sodium  100 mg Oral BID  . hydroxychloroquine  200 mg Oral Daily  . insulin aspart  0-9 Units Subcutaneous TID WC  . omega-3 acid ethyl esters  2 g Oral Daily  . pantoprazole  40 mg Oral Daily  . piperacillin-tazobactam (ZOSYN)  IV  3.375 g Intravenous Q8H  . sodium chloride  3 mL Intravenous Q12H   Continuous Infusions: . sodium chloride 100 mL/hr at 02/05/13 0413     Marinda Elk  [facility] Pager [phone]. If 8PM-8AM, please contact night-coverage at [email or web address], password [facility] 02/05/2013, 7:47 AM  LOS: 3 days

## 2013-02-05 NOTE — Progress Notes (Signed)
ANTIBIOTIC CONSULT NOTE - FOLLOW UP  Pharmacy Consult for Zosyn Indication: Empiric coverage of 2/2 GNR in BCx  Allergies  Allergen Reactions  . Betadine (Povidone Iodine) Itching and Other (See Comments)    Burns eyes    Patient Measurements: Height: 5\' 6"  (167.6 cm) Weight: 197 lb 4.8 oz (89.495 kg) IBW/kg (Calculated) : 59.3  Vital Signs: Temp: 98.8 F (37.1 C) (03/11 1000) Temp src: Oral (03/11 1000) BP: 128/48 mmHg (03/11 1000) Pulse Rate: 80 (03/11 1000) Intake/Output from previous day: 03/10 0701 - 03/11 0700 In: 3476.7 [P.O.:1140; I.V.:2236.7; IV Piggyback:100] Out: 1900 [Urine:1900] Intake/Output from this shift: Total I/O In: 360 [P.O.:360] Out: 350 [Urine:350]  Labs:  Recent Labs  02/02/13 2323 02/04/13 0529  WBC 17.6* 9.8  HGB 11.2* 10.8*  PLT 120* 87*  CREATININE 1.40* 1.47*   Estimated Creatinine Clearance: 32.1 ml/min (by C-G formula based on Cr of 1.47). No results found for this basename: VANCOTROUGH, Leodis Binet, VANCORANDOM, GENTTROUGH, GENTPEAK, GENTRANDOM, TOBRATROUGH, TOBRAPEAK, TOBRARND, AMIKACINPEAK, AMIKACINTROU, AMIKACIN,  in the last 72 hours   Microbiology: Recent Results (from the past 720 hour(s))  URINE CULTURE     Status: None   Collection Time    02/02/13  9:56 PM      Result Value Range Status   Specimen Description URINE, CLEAN CATCH   Final   Special Requests NONE   Final   Culture  Setup Time 02/03/2013 06:38   Final   Colony Count 15,000 COLONIES/ML   Final   Culture     Final   Value: GROUP B STREP(S.AGALACTIAE)ISOLATED     Note: TESTING AGAINST S. AGALACTIAE NOT ROUTINELY PERFORMED DUE TO PREDICTABILITY OF AMP/PEN/VAN SUSCEPTIBILITY.   Report Status 02/04/2013 FINAL   Final  CULTURE, BLOOD (ROUTINE X 2)     Status: None   Collection Time    02/02/13 11:20 PM      Result Value Range Status   Specimen Description BLOOD RIGHT THUMB   Final   Special Requests BOTTLES DRAWN AEROBIC ONLY 5cc   Final   Culture  Setup Time  02/03/2013 06:37   Final   Culture     Final   Value: GRAM NEGATIVE RODS     Note: Gram Stain Report Called to,Read Back By and Verified With: ALBERT RIO 02/03/13 @ 6:31PM BY RUSCA.   Report Status PENDING   Incomplete  CULTURE, BLOOD (ROUTINE X 2)     Status: None   Collection Time    02/03/13 12:10 AM      Result Value Range Status   Specimen Description BLOOD LEFT ARM   Final   Special Requests     Final   Value: BOTTLES DRAWN AEROBIC AND ANAEROBIC AER 5cc ANA 5cc   Culture  Setup Time 02/03/2013 06:36   Final   Culture     Final   Value: GRAM NEGATIVE RODS     Note: Gram Stain Report Called to,Read Back By and Verified With: ALBERT RIO 02/03/13 @ 5:26PM BY RUSCA.   Report Status PENDING   Incomplete    Anti-infectives   Start     Dose/Rate Route Frequency Ordered Stop   02/04/13 1000  hydroxychloroquine (PLAQUENIL) tablet 200 mg     200 mg Oral Daily 02/03/13 1247     02/03/13 1830  piperacillin-tazobactam (ZOSYN) IVPB 3.375 g     3.375 g 12.5 mL/hr over 240 Minutes Intravenous 3 times per day 02/03/13 1804     02/03/13 1500  vancomycin (VANCOCIN) 1,250  mg in sodium chloride 0.9 % 250 mL IVPB  Status:  Discontinued     1,250 mg 166.7 mL/hr over 90 Minutes Intravenous Every 24 hours 02/03/13 1343 02/04/13 0932   02/03/13 1400  cefTRIAXone (ROCEPHIN) 1 g in dextrose 5 % 50 mL IVPB  Status:  Discontinued     1 g 100 mL/hr over 30 Minutes Intravenous Every 24 hours 02/03/13 1234 02/03/13 1747   02/03/13 0730  hydroxychloroquine (PLAQUENIL) tablet 200 mg     200 mg Oral  Once 02/03/13 0725 02/03/13 1510   02/02/13 2345  cefTRIAXone (ROCEPHIN) 1 g in dextrose 5 % 50 mL IVPB     1 g 100 mL/hr over 30 Minutes Intravenous  Once 02/02/13 2335 02/03/13 0116      Assessment: 79 y.o. F who continues on Zosyn for empiric coverage of GNR in 2/2 BCx from 3/8. Microbiology was contacted today -- final speciation and sensitivities for this bacteria will be reported tomorrow (3/12). Tmax/24h:  99.7, WBC 9.8 << 17.6. Dose remains appropriate at this time.  Vanc 3/9 >> 3/10 Zosyn 3/9 >> Rocephin 3/9 >> 3/9  3/8 BCx >> 2/2 GNR (last updated 3/9 -- per Micro to get final result on 3/12) 3/8 UCx >> 15k Group B Strep  Goal of Therapy:  Proper antibiotics for infection/cultures adjusted for renal/hepatic function   Plan:  1. Continue Zosyn 3.375g IV every 8 hours 2. Will continue to follow renal function, culture results, LOT, and antibiotic de-escalation plans   Georgina Pillion, PharmD, BCPS Clinical Pharmacist Pager: [phone] 02/05/2013 12:58 PM

## 2013-02-05 NOTE — Evaluation (Signed)
Physical Therapy Evaluation Patient Details Name: Kristen Wallace MRN: [id number] DOB: 1926/07/31 Today's Date: 02/05/2013 Time: 4098-1191 PT Time Calculation (min): 25 min  PT Assessment / Plan / Recommendation Clinical Impression  Pt adm with sepsis.  Pt has made adaptations at home that have allowed her to remain at home and be as independent as possible.  Pt should be able to return home to this setting.  Will follow acutely to make sure pt is maintaining necessary strength and endurance to manage this.    PT Assessment  Patient needs continued PT services    Follow Up Recommendations  No PT follow up    Does the patient have the potential to tolerate intense rehabilitation      Barriers to Discharge        Equipment Recommendations  None recommended by PT    Recommendations for Other Services     Frequency Min 3X/week    Precautions / Restrictions Precautions Precautions: Fall   Pertinent Vitals/Pain Lt buttock pain due to spinal stenosis      Mobility  Bed Mobility Bed Mobility: Right Sidelying to Sit;Sitting - Scoot to Edge of Bed Right Sidelying to Sit: 6: Modified independent (Device/Increase time);With rails;HOB elevated Sitting - Scoot to Edge of Bed: 6: Modified independent (Device/Increase time) Transfers Transfers: Sit to Stand;Stand to Sit Sit to Stand: With upper extremity assist;From bed;From toilet;5: Supervision Stand to Sit: 5: Supervision;With upper extremity assist;To bed;To toilet Ambulation/Gait Ambulation/Gait Assistance: 5: Supervision Ambulation Distance (Feet): 125 Feet Assistive device: Rollator Ambulation/Gait Assistance Details: Pt with lt buttock pain that caused pt to stop or hesitate with her steps. Gait Pattern: Step-through pattern;Decreased stride length;Trunk flexed Gait velocity: decr General Gait Details: Pt self corrected flexed trunk    Exercises     PT Diagnosis: Generalized weakness;Difficulty walking  PT Problem List:  Decreased mobility;Decreased balance;Decreased strength PT Treatment Interventions: DME instruction;Gait training;Patient/family education;Functional mobility training;Therapeutic activities;Therapeutic exercise;Balance training   PT Goals Acute Rehab PT Goals PT Goal Formulation: With patient Time For Goal Achievement: 02/12/13 Potential to Achieve Goals: Good Pt will Ambulate: 51 - 150 feet;with modified independence;with least restrictive assistive device PT Goal: Ambulate - Progress: Goal set today  Visit Information  Last PT Received On: 02/05/13 Assistance Needed: +1    Subjective Data  Subjective: Pt states she does as much as she can for herself at home. Patient Stated Goal: Return home   Prior Functioning  Home Living Lives With: Alone Available Help at Discharge: Family;Available PRN/intermittently Home Access: Level entry Home Layout: One level Bathroom Shower/Tub: Health visitor: Handicapped height Home Adaptive Equipment: Walker - four wheeled;Grab bars in shower;Wheelchair - manual Prior Function Level of Independence: Needs assistance Needs Assistance: Light Housekeeping;Meal Prep Comments: amb in home with rollator mod independent Communication Communication: No difficulties    Cognition  Cognition Overall Cognitive Status: Appears within functional limits for tasks assessed/performed Arousal/Alertness: Awake/alert Orientation Level: Appears intact for tasks assessed Behavior During Session: [facility] for tasks performed    Extremity/Trunk Assessment Right Lower Extremity Assessment RLE ROM/Strength/Tone: Deficits RLE ROM/Strength/Tone Deficits: grossly 4/5 Left Lower Extremity Assessment LLE ROM/Strength/Tone: Deficits LLE ROM/Strength/Tone Deficits: grossly 4/5   Balance Balance Balance Assessed: Yes Static Standing Balance Static Standing - Balance Support: Bilateral upper extremity supported;During functional activity Static Standing -  Level of Assistance: 6: Modified independent (Device/Increase time)  End of Session PT - End of Session Activity Tolerance: Patient tolerated treatment well Patient left: in bed;with call bell/phone within reach;with family/visitor present  GP     MAYCOCK,CARY 02/05/2013, 3:49 PM  [facility] PT [phone]

## 2013-02-06 LAB — GLUCOSE, CAPILLARY
Glucose-Capillary: 102 mg/dL — ABNORMAL HIGH (ref 70–99)
Glucose-Capillary: 85 mg/dL (ref 70–99)

## 2013-02-06 LAB — CULTURE, BLOOD (ROUTINE X 2)

## 2013-02-06 LAB — CBC
Hemoglobin: 9.7 g/dL — ABNORMAL LOW (ref 12.0–15.0)
MCV: 98.9 fL (ref 78.0–100.0)
Platelets: 83 10*3/uL — ABNORMAL LOW (ref 150–400)
RBC: 2.83 MIL/uL — ABNORMAL LOW (ref 3.87–5.11)
WBC: 5.3 10*3/uL (ref 4.0–10.5)

## 2013-02-06 MED ORDER — DEXTROSE 5 % IV SOLN
1.0000 g | INTRAVENOUS | Status: DC
  Administered 2013-02-06: 1 g via INTRAVENOUS
  Filled 2013-02-06 (×3): qty 10

## 2013-02-06 NOTE — Progress Notes (Signed)
[facility] PROGRESS NOTE Interim History: 79 y.o. female with PMH significant for Diabetes, Colostomy, Spinal stenosis, steroids injections spine, Lupus, Hypertension who was transfer from [facility]. Patient presented to ED with history of fever started day prior to admission. Tempeture measure at home was 101.7. Patient was very lethargic, sluggish, sleepy per daughter report. Daughter notice patient urine was very dark. Patient relate history of urinary frequency, denies dysuria. She denies worsening of her chronic back pain. Last steroids injection was December 2013 and January 2014    Assessment/Plan: Fever and chills due to E.coli/Klebsiella Septic shock/bacteremia: - No fevers overnight.  - Zosyn 3.//2014- 02/06/13.  - 2/2 BC + gram negative rods, klebsiella and E.coli; both organism are pan-sensitive. -will finish 5 days of IV antibiotics due to bacteremia and then will transition to PO regimen to complete 2 weeks.  Strep agalactiae UTI -covered with current antibiotics.  Leukocytosis: - resolved.  Hypotension: - Bp stable, now. Responded to IVF's - no pressors required.  GERD: -Continue Protonix  Constipation: -continue colace   Code Status: Full Code.  Family Communication: Plan of care discussed with Daughter who was at bedside.  Disposition Plan: Expect to be discharge tomorrow 02/07/13   Consultants:  none  Procedures: MRI 3.10.2014: Severe multifactorial spinal stenosis at L4-5 likely to be  symptomatic. This may have worsened minimally since the previous study.  Moderate to severe multifactorial stenosis at L3-4 that could be symptomatic. This has worsened minimally since the previous study.  Moderate multifactorial stenosis at L2-3 but without definite focal neural compression.  Involution of a left posterolateral disc herniation at L5-S1 since the previous study. No definite neural compression presently at this level.  No changes in the marrow foci within  the L1 and L2 vertebral  bodies. This indicates a benign nature.   Antibiotics:  Zosyn 3/12  Rocephin 3/12  HPI/Subjective: Afebrile, no CP, no SOB, no abdominal pain. Patient denies dysuria.  Objective: Filed Vitals:   02/05/13 2055 02/06/13 0529 02/06/13 0937 02/06/13 1347  BP: 126/51 133/49 125/66 129/78  Pulse: 71 77 72 84  Temp: 98.2 F (36.8 C) 98.6 F (37 C) 98.3 F (36.8 C) 98.4 F (36.9 C)  TempSrc: Oral Oral Oral Oral  Resp: 18 18 18 18   Height: 5\' 6"  (1.676 m)     Weight:  89.495 kg (197 lb 4.8 oz)    SpO2: 100% 98% 98% 98%    Intake/Output Summary (Last 24 hours) at 02/06/13 1428 Last data filed at 02/06/13 0532  Gross per 24 hour  Intake    720 ml  Output    850 ml  Net   -130 ml   Filed Weights   02/03/13 2017 02/04/13 2114 02/06/13 0529  Weight: 89.495 kg (197 lb 4.8 oz) 89.495 kg (197 lb 4.8 oz) 89.495 kg (197 lb 4.8 oz)    Exam:  General: Alert, awake, oriented x3, in no acute distress.  HEENT: No bruits, no goiter.  Heart: Regular rate and rhythm, without murmurs, rubs, gallops.  Lungs: Good air movement, bilateral air movement.  Abdomen: Soft, nontender, stoma in not tender. Neuro: Grossly intact, nonfocal.   Data Reviewed: Basic Metabolic Panel:  Recent Labs Lab 02/02/13 2323 02/04/13 0529  NA 138 133*  K 4.0 3.8  CL 100 100  CO2 26 24  GLUCOSE 127* 87  BUN 31* 33*  CREATININE 1.40* 1.47*  CALCIUM 9.2 7.9*   Liver Function Tests:  Recent Labs Lab 02/02/13 2323 02/04/13 0529  AST 45*  32  ALT 21 20  ALKPHOS 63 68  BILITOT 0.8 0.5  PROT 5.8* 5.2*  ALBUMIN 3.0* 2.5*   CBC:  Recent Labs Lab 02/02/13 2323 02/04/13 0529 02/06/13 0529  WBC 17.6* 9.8 5.3  NEUTROABS 16.1*  --   --   HGB 11.2* 10.8* 9.7*  HCT 33.6* 31.4* 28.0*  MCV 105.0* 102.6* 98.9  PLT 120* 87* 83*   CBG:  Recent Labs Lab 02/05/13 1150 02/05/13 1633 02/05/13 2059 02/06/13 0758 02/06/13 1207  GLUCAP 102* 136* 139* 82 102*    Recent  Results (from the past 240 hour(s))  URINE CULTURE     Status: None   Collection Time    02/02/13  9:56 PM      Result Value Range Status   Specimen Description URINE, CLEAN CATCH   Final   Special Requests NONE   Final   Culture  Setup Time 02/03/2013 06:38   Final   Colony Count 15,000 COLONIES/ML   Final   Culture     Final   Value: GROUP B STREP(S.AGALACTIAE)ISOLATED     Note: TESTING AGAINST S. AGALACTIAE NOT ROUTINELY PERFORMED DUE TO PREDICTABILITY OF AMP/PEN/VAN SUSCEPTIBILITY.   Report Status 02/04/2013 FINAL   Final  CULTURE, BLOOD (ROUTINE X 2)     Status: None   Collection Time    02/02/13 11:20 PM      Result Value Range Status   Specimen Description BLOOD RIGHT THUMB   Final   Special Requests BOTTLES DRAWN AEROBIC ONLY 5cc   Final   Culture  Setup Time 02/03/2013 06:37   Final   Culture     Final   Value: GRAM NEGATIVE RODS     Note: Gram Stain Report Called to,Read Back By and Verified With: ALBERT RIO 02/03/13 @ 6:31PM BY RUSCA.   Report Status PENDING   Incomplete  CULTURE, BLOOD (ROUTINE X 2)     Status: None   Collection Time    02/03/13 12:10 AM      Result Value Range Status   Specimen Description BLOOD LEFT ARM   Final   Special Requests     Final   Value: BOTTLES DRAWN AEROBIC AND ANAEROBIC AER 5cc ANA 5cc   Culture  Setup Time 02/03/2013 06:36   Final   Culture     Final   Value: KLEBSIELLA OXYTOCA     ESCHERICHIA COLI     Note: Gram Stain Report Called to,Read Back By and Verified With: ALBERT RIO 02/03/13 @ 5:26PM BY RUSCA.   Report Status 02/06/2013 FINAL   Final   Organism ID, Bacteria KLEBSIELLA OXYTOCA   Final   Organism ID, Bacteria ESCHERICHIA COLI   Final     Studies: No results found.  Scheduled Meds: . antiseptic oral rinse  15 mL Mouth Rinse BID  . cefTRIAXone (ROCEPHIN)  IV  1 g Intravenous Q24H  . darifenacin  15 mg Oral Daily  . docusate sodium  100 mg Oral BID  . hydroxychloroquine  200 mg Oral Daily  . insulin aspart  0-9 Units  Subcutaneous TID WC  . omega-3 acid ethyl esters  2 g Oral Daily  . pantoprazole  40 mg Oral Daily  . sodium chloride  3 mL Intravenous Q12H   Continuous Infusions: . sodium chloride 20 mL/hr at 02/05/13 1955     MADERA,CARLOS  [facility] Pager [phone]. If 8PM-8AM, please contact night-coverage at [email or web address], password [facility] 02/06/2013, 2:28 PM  LOS: 4 days

## 2013-02-07 DIAGNOSIS — R5381 Other malaise: Secondary | ICD-10-CM

## 2013-02-07 DIAGNOSIS — I10 Essential (primary) hypertension: Secondary | ICD-10-CM

## 2013-02-07 LAB — GLUCOSE, CAPILLARY: Glucose-Capillary: 109 mg/dL — ABNORMAL HIGH (ref 70–99)

## 2013-02-07 MED ORDER — LEVOFLOXACIN 500 MG PO TABS: 500.0000 mg | ORAL_TABLET | Freq: Every day | ORAL | Status: DC

## 2013-02-07 NOTE — Care Management Note (Addendum)
   CARE MANAGEMENT NOTE 02/07/2013  Patient:  Kristen Wallace, Kristen Wallace   Account Number:  [id number]  Date Initiated:  02/04/2013  Documentation initiated by:  Darlyne Russian  Subjective/Objective Assessment:   Patient admitted with fever, lethargic.  PMH: Lupus, colostomy, spinal stenosis.  Lives at home with daughter     Action/Plan:   Progression of care and discharge planning   Anticipated DC Date:  02/07/2013   Anticipated DC Plan:  HOME W HOME HEALTH SERVICES         [facility] Choice  HOME HEALTH   Choice offered to / List presented to:  C-1 Patient        HH arranged  HH-2 PT      [facility] agency  [facility]   Status of service:  Completed, signed off Medicare Important Message given?   (If response is "NO", the following Medicare IM given date fields will be blank) Date Medicare IM given:   Date Additional Medicare IM given:    Discharge Disposition:    Per UR Regulation:  Reviewed for med. necessity/level of care/duration of stay  If discussed at Long Length of Stay Meetings, dates discussed:    Comments:    Per pt and daughter pt has rolling walker and wheelchair, Gentive to start [facility] services within 48 hr of d/c. Johny Shock RN MPH, case manager , [phone]

## 2013-02-07 NOTE — Progress Notes (Signed)
Pt discharged, left unit via wheelchair and NT, belongings with daughter.

## 2013-02-07 NOTE — Discharge Summary (Signed)
Physician Discharge Summary  Kristen Wallace ZOX:[id number] DOB: November 26, 1926 DOA: 02/02/2013  PCP: No primary provider on file.  Admit date: 02/02/2013 Discharge date: 02/07/2013  Time spent: >30 minutes  Recommendations for Outpatient Follow-up:  1. Reassess BP and adjust medication as needed 2. Check BMET to follow renal function and elctrolytes  Discharge Diagnoses:  Active Problems:   Fever and chills   Leukocytosis   Hypotension, unspecified   Septic shock   Bacteremia HTN UTI Physical deconditioning  Discharge Condition: stable; will discharge home with [facility] services for physical deconditioning. Follow up with PCP in 2 weeks.  Diet recommendation: heart healthy diet  Filed Weights   02/04/13 2114 02/06/13 0529 02/06/13 2219  Weight: 89.495 kg (197 lb 4.8 oz) 89.495 kg (197 lb 4.8 oz) 91.899 kg (202 lb 9.6 oz)    History of present illness:  79 y.o. female with PMH significant for Diabetes, Colostomy, Spinal stenosis, steroids injections spine, Lupus, Hypertension who was transfer from [facility]. Patient presented to ED with history of fever started day prior to admission. Tempeture measure at home was 101.7. Patient was very lethargic, sluggish, sleepy per daughter report. Daughter notice patient urine was very dark. Patient relate history of urinary frequency, denies dysuria. She denies worsening of her chronic back pain. Last steroids injection was December 2013 and January 2014   Hospital Course:  Fever and chills due to E.coli/Klebsiella Septic shock/bacteremia:  - No fevers overnight.  - Zosyn 02/03/2013- 02/06/13.  -Rocephin 3/12/ to 3/13 - 2/2 BC + gram negative rods, klebsiella and E.coli; both organism are pan-sensitive.  -finished 5 days of IV antibiotics due to bacteremia and then transition to PO regimen to complete 2 weeks. Will use levaquin.   Strep agalactiae UTI  -covered with current antibiotics.   Leukocytosis:  - resolved.   Hypotension due to septic shock:  -  Bp stable, now. Responded to IVF's  - no pressors required.   HTN: -resume home meds and low sodium diet  GERD:  -Continue Protonix   Physical deconditioning: -Home Health services arrange for HHPT.  Rest of medical problems remains stable during hospitalization and the plan is to continue current medication regimen.  Procedures: See below for x-ray reports.  Consultations:    Discharge Exam: Filed Vitals:   02/06/13 1643 02/06/13 2219 02/07/13 0527 02/07/13 1037  BP: 147/74 158/65 145/58 153/77  Pulse: 71 84 85 79  Temp: 98.3 F (36.8 C) 98.7 F (37.1 C) 98.7 F (37.1 C) 98.1 F (36.7 C)  TempSrc: Oral Oral Oral Oral  Resp: 18 18 18 18   Height:      Weight:  91.899 kg (202 lb 9.6 oz)    SpO2: 97% 97% 97% 98%   General: Alert, awake, oriented x3, in no acute distress.  HEENT: No bruits, no goiter.  Heart: Regular rate and rhythm, without murmurs, rubs, gallops.  Lungs: Good air movement, bilateral air movement.  Abdomen: Soft, nontender, stoma in not tender.  Neuro: Grossly intact, nonfocal.  Discharge Instructions  Discharge Orders   Future Orders Complete By Expires     Diet - low sodium heart healthy  As directed     Discharge instructions  As directed     Comments:      Keep yourself hydrated Take medications as prescribed Arrange follow up with PCP in 7-10 days    Face-to-face encounter (required for Medicare/Medicaid patients)  As directed     Comments:      I MADERA,CARLOS certify that this patient  is under my care and that I, or a nurse practitioner or physician's assistant working with me, had a face-to-face encounter that meets the physician face-to-face encounter requirements with this patient on 02/07/2013. The encounter with the patient was in whole, or in part for the following medical condition(s) which is the primary reason for home health care (List medical condition): deconditioning from chronic back pain and also acute infection  (UTI/bacteremia)    Questions:      The encounter with the patient was in whole, or in part, for the following medical condition, which is the primary reason for home health care:  deconditioning from chronic back pain and also acute infection (UTI/bacteremia)    I certify that, based on my findings, the following services are medically necessary home health services:  Physical therapy    My clinical findings support the need for the above services:  Unable to leave home safely without assistance and/or assistive device    Further, I certify that my clinical findings support that this patient is homebound due to:  Unable to leave home safely without assistance    Reason for Medically Necessary Home Health Services:  Therapy- Investment banker, operational, Patent examiner    Therapy- Instruction on use of Assistive Device for Ambulation on all Surfaces    Home Health  As directed     Questions:      To provide the following care/treatments:  PT        Medication List    TAKE these medications       atenolol 25 MG tablet  Commonly known as:  TENORMIN  Take 12.5 mg by mouth daily.     CALCIUM CITRATE PO  Take 2 tablets by mouth every evening. 330mg  each     Co Q-10 100 MG Caps  Take 1 capsule by mouth daily.     ENABLEX 15 MG 24 hr tablet  Generic drug:  darifenacin  Take 15 mg by mouth daily.     hydrochlorothiazide 12.5 MG capsule  Commonly known as:  MICROZIDE  Take 12.5 mg by mouth daily.     hydroxychloroquine 200 MG tablet  Commonly known as:  PLAQUENIL  Take 200 mg by mouth daily.     ICAPS PO  Take 1 capsule by mouth 2 (two) times daily.     levofloxacin 500 MG tablet  Commonly known as:  LEVAQUIN  Take 1 tablet (500 mg total) by mouth daily.     multivitamin capsule  Take 1 capsule by mouth daily.     Omega-3 Fatty Acids 900 MG Caps  Take 1 capsule by mouth daily.     omeprazole 20 MG capsule  Commonly known as:  PRILOSEC  Take 20 mg by mouth daily.      pioglitazone 30 MG tablet  Commonly known as:  ACTOS  Take 30 mg by mouth daily.     telmisartan 40 MG tablet  Commonly known as:  MICARDIS  Take 40 mg by mouth daily.        The results of significant diagnostics from this hospitalization (including imaging, microbiology, ancillary and laboratory) are listed below for reference.    Significant Diagnostic Studies: Dg Chest 2 View  02/02/2013  *RADIOLOGY REPORT*  Clinical Data: Fever, chills, nausea  CHEST - 2 VIEW  Comparison: Prior chest x-ray 01/26/2008  Findings: No focal airspace consolidation to suggest pneumonia. Mild borderline elevation of the right hemidiaphragm similar to prior.  Chronic interstitial prominence.  Cardiac  and mediastinal contours remain within normal limits.  No pleural effusion or pneumothorax.  No acute osseous abnormality.  IMPRESSION: No acute cardiopulmonary disease.  Specifically, negative for pneumonia.   Original Report Authenticated By: Malachy Moan, M.D.    Mr Lumbar Spine Wo Contrast  02/04/2013  *RADIOLOGY REPORT*  Clinical Data: Low back pain radiating to both legs.  Fever.  UTI.  MRI LUMBAR SPINE WITHOUT CONTRAST  Technique:  Multiplanar and multiecho pulse sequences of the lumbar spine were obtained without intravenous contrast.  Comparison: 04/06/2012  Findings: Previously described foci of low T1 marrow signal within the L1 and L2 vertebral bodies have not changed in the last 10 months and cannot be conclusively described as benign foci.  There are no progressive or worrisome marrow space lesions.  There is mild curvature convex to the left at the apex at L2-3. The distal cord and conus are normal with conus tip at lower L1.  L1-2:  Mild bulging of the disc.  Mild facet and ligamentous hypertrophy.  Mild stenosis of the canal but without apparent neural compression.  L2-3:  Shallow circumferential protrusion of the disc.  Facet and ligamentous hypertrophy right worse than left.  Moderate  multifactorial stenosis with AP diameter of the thecal sac measuring only 6 mm.  No definite focal neural compression however.  L3-4:  Circumferential protrusion of disc material.  Facet and ligamentous hypertrophy.  Severe multifactorial stenosis with AP diameter of the thecal sac measuring only 5 mm.  Neural compression could occur at this level.  L4-5:  Advanced bilateral facet arthropathy allowing anterolisthesis of 4 mm.  Circumferential protrusion of disc material.  Facet and ligamentous hypertrophy.  Very severe multifactorial spinal stenosis with a markedly constricted thecal sac.  L5-S1:  Bulging of the disc. Facet and ligamentous hypertrophy. Narrowing of the subarticular lateral recesses right more than left but no definite neural compression. On the previous study, there was herniated disc material towards the left that could have cause neural compression, but seems to have involuted on today's study.  Compared to the study of last May, findings are very similar, possibly minimally progressive at L3-4 and L4-5. Involution of a left posterolateral disc herniation at L5-S1.  IMPRESSION: Severe multifactorial spinal stenosis at L4-5 likely to be symptomatic.  This may have worsened minimally since the previous study.  Moderate to severe multifactorial stenosis at L3-4 that could be symptomatic.  This has worsened minimally since the previous study.  Moderate multifactorial stenosis at L2-3 but without definite focal neural compression.  Involution of a left posterolateral disc herniation at L5-S1 since the previous study.  No definite neural compression presently at this level.  No changes in the marrow foci within the L1 and L2 vertebral bodies.  This indicates a benign nature.   Original Report Authenticated By: Paulina Fusi, M.D.     Microbiology: Recent Results (from the past 240 hour(s))  URINE CULTURE     Status: None   Collection Time    02/02/13  9:56 PM      Result Value Range Status    Specimen Description URINE, CLEAN CATCH   Final   Special Requests NONE   Final   Culture  Setup Time 02/03/2013 06:38   Final   Colony Count 15,000 COLONIES/ML   Final   Culture     Final   Value: GROUP B STREP(S.AGALACTIAE)ISOLATED     Note: TESTING AGAINST S. AGALACTIAE NOT ROUTINELY PERFORMED DUE TO PREDICTABILITY OF AMP/PEN/VAN SUSCEPTIBILITY.   Report  Status 02/04/2013 FINAL   Final  CULTURE, BLOOD (ROUTINE X 2)     Status: None   Collection Time    02/02/13 11:20 PM      Result Value Range Status   Specimen Description BLOOD RIGHT THUMB   Final   Special Requests BOTTLES DRAWN AEROBIC ONLY 5cc   Final   Culture  Setup Time 02/03/2013 06:37   Final   Culture     Final   Value: GRAM NEGATIVE RODS     Note: Gram Stain Report Called to,Read Back By and Verified With: ALBERT RIO 02/03/13 @ 6:31PM BY RUSCA.   Report Status PENDING   Incomplete  CULTURE, BLOOD (ROUTINE X 2)     Status: None   Collection Time    02/03/13 12:10 AM      Result Value Range Status   Specimen Description BLOOD LEFT ARM   Final   Special Requests     Final   Value: BOTTLES DRAWN AEROBIC AND ANAEROBIC AER 5cc ANA 5cc   Culture  Setup Time 02/03/2013 06:36   Final   Culture     Final   Value: KLEBSIELLA OXYTOCA     ESCHERICHIA COLI     Note: Gram Stain Report Called to,Read Back By and Verified With: ALBERT RIO 02/03/13 @ 5:26PM BY RUSCA.   Report Status 02/06/2013 FINAL   Final   Organism ID, Bacteria KLEBSIELLA OXYTOCA   Final   Organism ID, Bacteria ESCHERICHIA COLI   Final     Labs: Basic Metabolic Panel:  Recent Labs Lab 02/02/13 2323 02/04/13 0529  NA 138 133*  K 4.0 3.8  CL 100 100  CO2 26 24  GLUCOSE 127* 87  BUN 31* 33*  CREATININE 1.40* 1.47*  CALCIUM 9.2 7.9*   Liver Function Tests:  Recent Labs Lab 02/02/13 2323 02/04/13 0529  AST 45* 32  ALT 21 20  ALKPHOS 63 68  BILITOT 0.8 0.5  PROT 5.8* 5.2*  ALBUMIN 3.0* 2.5*   CBC:  Recent Labs Lab 02/02/13 2323 02/04/13 0529  02/06/13 0529  WBC 17.6* 9.8 5.3  NEUTROABS 16.1*  --   --   HGB 11.2* 10.8* 9.7*  HCT 33.6* 31.4* 28.0*  MCV 105.0* 102.6* 98.9  PLT 120* 87* 83*   CBG:  Recent Labs Lab 02/06/13 0758 02/06/13 1207 02/06/13 1645 02/06/13 2222 02/07/13 0728  GLUCAP 82 102* 85 104* 74    Signed:  MADERA,CARLOS  [facility] 02/07/2013, 12:22 PM

## 2013-02-09 LAB — CULTURE, BLOOD (ROUTINE X 2)

## 2014-03-07 ENCOUNTER — Ambulatory Visit: Payer: PRIVATE HEALTH INSURANCE | Admitting: Occupational Therapy

## 2014-03-12 ENCOUNTER — Ambulatory Visit: Payer: Medicare Other | Attending: Ophthalmology | Admitting: Occupational Therapy

## 2014-03-12 DIAGNOSIS — H3530 Unspecified macular degeneration: Secondary | ICD-10-CM | POA: Insufficient documentation

## 2014-03-12 DIAGNOSIS — IMO0001 Reserved for inherently not codable concepts without codable children: Secondary | ICD-10-CM | POA: Diagnosis present

## 2014-03-12 DIAGNOSIS — H53419 Scotoma involving central area, unspecified eye: Secondary | ICD-10-CM | POA: Insufficient documentation

## 2014-04-02 ENCOUNTER — Ambulatory Visit: Payer: Medicare Other | Attending: Ophthalmology | Admitting: Occupational Therapy

## 2014-04-02 DIAGNOSIS — IMO0001 Reserved for inherently not codable concepts without codable children: Secondary | ICD-10-CM | POA: Diagnosis present

## 2014-04-02 DIAGNOSIS — H3530 Unspecified macular degeneration: Secondary | ICD-10-CM | POA: Diagnosis not present

## 2014-04-02 DIAGNOSIS — H53419 Scotoma involving central area, unspecified eye: Secondary | ICD-10-CM | POA: Insufficient documentation

## 2015-10-16 ENCOUNTER — Encounter: Payer: Medicare Other | Attending: Surgery | Admitting: Surgery

## 2015-10-16 DIAGNOSIS — L98499 Non-pressure chronic ulcer of skin of other sites with unspecified severity: Secondary | ICD-10-CM | POA: Insufficient documentation

## 2015-10-16 DIAGNOSIS — Z992 Dependence on renal dialysis: Secondary | ICD-10-CM | POA: Diagnosis not present

## 2015-10-16 DIAGNOSIS — I10 Essential (primary) hypertension: Secondary | ICD-10-CM | POA: Diagnosis not present

## 2015-10-16 DIAGNOSIS — M329 Systemic lupus erythematosus, unspecified: Secondary | ICD-10-CM | POA: Diagnosis not present

## 2015-10-16 DIAGNOSIS — K9400 Colostomy complication, unspecified: Secondary | ICD-10-CM | POA: Insufficient documentation

## 2015-10-16 DIAGNOSIS — E11622 Type 2 diabetes mellitus with other skin ulcer: Secondary | ICD-10-CM | POA: Insufficient documentation

## 2015-10-16 DIAGNOSIS — G629 Polyneuropathy, unspecified: Secondary | ICD-10-CM | POA: Diagnosis not present

## 2015-10-16 DIAGNOSIS — N189 Chronic kidney disease, unspecified: Secondary | ICD-10-CM | POA: Insufficient documentation

## 2015-10-16 DIAGNOSIS — M1990 Unspecified osteoarthritis, unspecified site: Secondary | ICD-10-CM | POA: Insufficient documentation

## 2015-10-18 NOTE — Progress Notes (Signed)
NASIRA, JANUSZ ([id number]) Visit Report for 10/16/2015 Abuse/Suicide Risk Screen Details Patient Name: Kristen Wallace, Kristen Wallace 10/16/2015 2:00 Date of Service: PM Medical Record [id number] Number: Patient Account Number: [id number] 02-26-1926 (81 y.o. Treating RN: Curtis Sites Date of Birth/Sex: Female) Other Clinician: Primary Care Physician: Merri Brunette Treating Britto, Errol Referring Physician: Merri Brunette Physician/Extender: Weeks in Treatment: 0 Abuse/Suicide Risk Screen Items Answer ABUSE/SUICIDE RISK SCREEN: Has anyone close to you tried to hurt or harm you recentlyo No Do you feel uncomfortable with anyone in your familyo No Has anyone forced you do things that you didnot want to doo No Do you have any thoughts of harming yourselfo No Patient displays signs or symptoms of abuse and/or neglect. No Electronic Signature(s) Signed: 10/16/2015 5:39:33 PM By: Curtis Sites Entered By: Curtis Sites on 10/16/2015 14:42:33 Kristen Bake ([id number]) -------------------------------------------------------------------------------- Activities of Daily Living Details Patient Name: Kristen Wallace, Kristen Wallace 10/16/2015 2:00 Date of Service: PM Medical Record [id number] Number: Patient Account Number: [id number] Mar 31, 1926 (81 y.o. Treating RN: Curtis Sites Date of Birth/Sex: Female) Other Clinician: Primary Care Physician: Merri Brunette Treating Evlyn Kanner Referring Physician: Merri Brunette Physician/Extender: Tania Ade in Treatment: 0 Activities of Daily Living Items Answer Activities of Daily Living (Please select one for each item) Drive Automobile Not Able Take Medications Completely Able Use Telephone Completely Able Care for Appearance Completely Able Use Toilet Completely Able Bath / Shower Need Assistance Dress Self Completely Able Feed Self Completely Able Walk Need Assistance Get In / Out Bed Completely Able Housework Need Assistance Prepare Meals Completely Able Handle  Money Completely Able Shop for Self Need Assistance Electronic Signature(s) Signed: 10/16/2015 5:39:33 PM By: Curtis Sites Entered By: Curtis Sites on 10/16/2015 14:43:04 Kristen Bake ([id number]) -------------------------------------------------------------------------------- Education Assessment Details Patient Name: Kristen Wallace, Kristen Wallace 10/16/2015 2:00 Date of Service: PM Medical Record [id number] Number: Patient Account Number: [id number] 1926/10/30 (81 y.o. Treating RN: Curtis Sites Date of Birth/Sex: Female) Other Clinician: Primary Care Physician: Ronnald Nian Referring Physician: Merri Brunette Physician/Extender: Tania Ade in Treatment: 0 Primary Learner Assessed: Patient Learning Preferences/Education Level/Primary Language Learning Preference: Explanation Highest Education Level: High School Preferred Language: English Cognitive Barrier Assessment/Beliefs Language Barrier: No Translator Needed: No Memory Deficit: No Emotional Barrier: No Cultural/Religious Beliefs Affecting Medical No Care: Physical Barrier Assessment Impaired Vision: No Impaired Hearing: Yes Hearing Aid Decreased Hand dexterity: No Knowledge/Comprehension Assessment Knowledge Level: Medium Comprehension Level: Medium Ability to understand written Medium instructions: Ability to understand verbal Medium instructions: Motivation Assessment Anxiety Level: Calm Cooperation: Cooperative Education Importance: Acknowledges Need Interest in Health Problems: Asks Questions Perception: Coherent Willingness to Engage in Self- Medium Management Activities: Readiness to Engage in Self- Medium Management Activities: ELLAREE, GEAR ([id number]) Electronic Signature(s) Signed: 10/16/2015 5:39:33 PM By: Curtis Sites Entered By: Curtis Sites on 10/16/2015 14:44:01 Kristen Bake ([id number]) -------------------------------------------------------------------------------- Fall  Risk Assessment Details Patient Name: Kristen Wallace, Kristen Wallace 10/16/2015 2:00 Date of Service: PM Medical Record [id number] Number: Patient Account Number: [id number] [birth year] (81 y.o. Treating RN: Curtis Sites Date of Birth/Sex: Female) Other Clinician: Primary Care Physician: Merri Brunette Treating Britto, Errol Referring Physician: Merri Brunette Physician/Extender: Tania Ade in Treatment: 0 Fall Risk Assessment Items FALL RISK ASSESSMENT: History of falling - immediate or within 3 months 0 No Secondary diagnosis 0 No Ambulatory aid None/bed rest/wheelchair/nurse 0 No Crutches/cane/walker 15 Yes Furniture 0 No IV Access/Saline Lock 0 No Gait/Training Normal/bed rest/immobile 0 No Weak 10 Yes Impaired 0 No Mental Status Oriented to own ability 0 Yes Electronic Signature(s) Signed: 10/16/2015 5:39:33 PM By: Curtis Sites Entered By: Curtis Sites  on 10/16/2015 14:44:16 Kristen Wallace, Kristen Wallace ([id number]) -------------------------------------------------------------------------------- Nutrition Risk Assessment Details Patient Name: Kristen Wallace, Kristen Wallace 10/16/2015 2:00 Date of Service: PM Medical Record [id number] Number: Patient Account Number: [id number] 12-Jul-1926 (81 y.o. Treating RN: Curtis Sitesorthy, Joanna Date of Birth/Sex: Female) Other Clinician: Primary Care Physician: Merri BrunettePHARR, WALTER Treating Britto, Errol Referring Physician: Merri BrunettePHARR, WALTER Physician/Extender: Weeks in Treatment: 0 Height (in): 65 Weight (lbs): 165 Body Mass Index (BMI): 27.5 Nutrition Risk Assessment Items NUTRITION RISK SCREEN: I have an illness or condition that made me change the kind and/or 0 No amount of food I eat I eat fewer than two meals per day 0 No I eat few fruits and vegetables, or milk products 0 No I have three or more drinks of beer, liquor or wine almost every day 0 No I have tooth or mouth problems that make it hard for me to eat 0 No I don't always have enough money to buy the food I need 0 No I eat  alone most of the time 0 No I take three or more different prescribed or over-the-counter drugs a 1 Yes day Without wanting to, I have lost or gained 10 pounds in the last six 0 No months I am not always physically able to shop, cook and/or feed myself 0 No Nutrition Protocols Good Risk Protocol 0 No interventions needed Moderate Risk Protocol Electronic Signature(s) Signed: 10/16/2015 5:39:33 PM By: Curtis Sitesorthy, Joanna Entered By: Curtis Sitesorthy, Joanna on 10/16/2015 14:44:22

## 2015-10-18 NOTE — Progress Notes (Signed)
Kristen Wallace, Kristen Wallace ([id number]) Visit Report for 10/16/2015 Chief Complaint Document Details Patient Name: Kristen Wallace, Kristen Wallace 10/16/2015 2:00 Date of Service: PM Medical Record [id number] Number: Patient Account Number: [id number] 1926/09/09 (81 y.o. Treating RN: Curtis Sites Date of Birth/Sex: Female) Other Clinician: Primary Care Physician: Ronnald Nian Referring Physician: Merri Brunette Physician/Extender: Tania Ade in Treatment: 0 Information Obtained from: Patient Chief Complaint Patient presents to the wound care center for a consult due non healing wound she had a nodule above the left lower quadrant colostomy for about 6 months which has now opened into an ulcer for the last week. Electronic Signature(s) Signed: 10/16/2015 3:37:59 PM By: Evlyn Kanner MD, FACS Entered By: Evlyn Kanner on 10/16/2015 15:37:59 Kristen Wallace ([id number]) -------------------------------------------------------------------------------- Debridement Details Patient Name: Kristen Wallace, Kristen Wallace 10/16/2015 2:00 Date of Service: PM Medical Record [id number] Number: Patient Account Number: [id number] [birth year] (81 y.o. Treating RN: Curtis Sites Date of Birth/Sex: Female) Other Clinician: Primary Care Physician: Merri Brunette Treating Kristen Wallace Referring Physician: Merri Brunette Physician/Extender: Tania Ade in Treatment: 0 Debridement Performed for Wound #1 Left Abdomen - Lower Quadrant Assessment: Performed By: Physician Evlyn Kanner, MD Debridement: Debridement Pre-procedure Yes Verification/Time Out Taken: Start Time: 15:06 Pain Control: Lidocaine 4% Topical Solution Level: Skin/Subcutaneous Tissue Total Area Debrided (L x 1 (cm) x 1 (cm) = 1 (cm) W): Tissue and other Viable, Non-Viable, Eschar, Fibrin/Slough, Subcutaneous material debrided: Instrument: Curette Bleeding: Minimum Hemostasis Achieved: Pressure End Time: 15:08 Procedural Pain: 0 Post Procedural Pain:  0 Response to Treatment: Procedure was tolerated well Post Debridement Measurements of Total Wound Length: (cm) 1 Width: (cm) 1 Depth: (cm) 0.2 Volume: (cm) 0.157 Post Procedure Diagnosis Same as Pre-procedure Electronic Signature(s) Signed: 10/16/2015 4:42:27 PM By: Evlyn Kanner MD, FACS Signed: 10/16/2015 5:39:33 PM By: Curtis Sites Entered By: Curtis Sites on 10/16/2015 15:07:54 Kristen Wallace ([id number]) -------------------------------------------------------------------------------- HPI Details Patient Name: Kristen Wallace, Kristen Wallace 10/16/2015 2:00 Date of Service: PM Medical Record [id number] Number: Patient Account Number: [id number] Apr 15, 1926 (81 y.o. Treating RN: Curtis Sites Date of Birth/Sex: Female) Other Clinician: Primary Care Physician: Merri Brunette Treating Evlyn Kanner Referring Physician: Merri Brunette Physician/Extender: Weeks in Treatment: 0 History of Present Illness Location: ulcerative area just above the colostomy opening in the left lower quadrant Quality: Patient reports No Pain. Severity: Patient states wound (s) are getting better. Duration: Patient has had the wound for > 6 months prior to seeking treatment at the wound center Context: The wound appeared gradually over time. it was a nodule initially and gradually broke down into an open ulcer. Modifying Factors: Other treatment(s) tried include: her PCP has put her on Augmentin recently Associated Signs and Symptoms: Wound has no purlulent drainage and her colostomy is functioning fine HPI Description: Very pleasant 81 year old patient whose had a nodule just above the stoma and the left lower quadrant where she's had a colostomy for about 17-1/2 years. about a week ago the nodule opened into an ulcerated area and she was recently put on Augmentin for this. Colostomy is functioning fine and she has no issues with applying the bag. Her past medical history significant for diabetes mellitus type 2  SLE, OA, DDD, osteopenia, narcotic contract. Of note she is on chronic doses of prednisone which is 10 mg daily. She has never been a smoker. Electronic Signature(s) Signed: 10/16/2015 3:41:50 PM By: Evlyn Kanner MD, FACS Entered By: Evlyn Kanner on 10/16/2015 15:41:50 Kristen Wallace, Kristen Wallace ([id number]) -------------------------------------------------------------------------------- Physical Exam Details Patient Name: Kristen Wallace, Kristen Wallace 10/16/2015 2:00 Date of Service: PM Medical Record [id number] Number: Patient Account  Number: [id number] [birth year] (81 y.o. Treating RN: Curtis Sites Date of Birth/Sex: Female) Other Clinician: Primary Care Physician: Merri Brunette Treating Evlyn Kanner Referring Physician: Merri Brunette Physician/Extender: Weeks in Treatment: 0 Constitutional . Pulse regular. Respirations normal and unlabored. Afebrile. . Eyes Nonicteric. Reactive to light. Ears, Nose, Mouth, and Throat Lips, teeth, and gums WNL.Marland Kitchen Moist mucosa without lesions . Neck supple and nontender. No palpable supraclavicular or cervical adenopathy. Normal sized without goiter. Respiratory WNL. No retractions.. Cardiovascular Pedal Pulses WNL. No clubbing, cyanosis or edema. Genitourinary (GU) No hydrocele, spermatocele, tenderness of the cord, or testicular mass.Marland Kitchen Penis without lesions.Renetta Chalk without lesions. No cystocele, or rectocele. Pelvic support intact, no discharge. Marland Kitchen Urethra without masses, tenderness or scarring.Marland Kitchen Lymphatic No adneopathy. No adenopathy. No adenopathy. Musculoskeletal Adexa without tenderness or enlargement.. Digits and nails w/o clubbing, cyanosis, infection, petechiae, ischemia, or inflammatory conditions.. Integumentary (Hair, Skin) No suspicious lesions. No crepitus or fluctuance. No peri-wound warmth or erythema. No masses.Marland Kitchen Psychiatric Judgement and insight Intact.. No evidence of depression, anxiety, or agitation.. Notes She has a functioning colostomy  with a stoma which looks excellent in the left lower quadrant. Just superior to this at the 12:00 position there is an ulcerated area with minimal slough and the edges show no signs of eversion or cellulitis.the minimal slough need sharp debridement with a curette Electronic Signature(s) Signed: 10/16/2015 3:43:20 PM By: Evlyn Kanner MD, FACS Entered By: Evlyn Kanner on 10/16/2015 15:43:20 Kristen Wallace, Kristen Wallace ([id number]) JASEY, CORTEZ ([id number]) -------------------------------------------------------------------------------- Physician Orders Details Patient Name: Kristen Wallace, Kristen Wallace 10/16/2015 2:00 Date of Service: PM Medical Record [id number] Number: Patient Account Number: [id number] March 23, 1926 (81 y.o. Treating RN: Curtis Sites Date of Birth/Sex: Female) Other Clinician: Primary Care Physician: Ronnald Nian Referring Physician: Merri Brunette Physician/Extender: Tania Ade in Treatment: 0 Verbal / Phone Orders: Yes Clinician: Curtis Sites Read Back and Verified: Yes Diagnosis Coding Wound Cleansing Wound #1 Left Abdomen - Lower Quadrant o Clean wound with Normal Saline. Anesthetic Wound #1 Left Abdomen - Lower Quadrant o Topical Lidocaine 4% cream applied to wound bed prior to debridement Primary Wound Dressing Wound #1 Left Abdomen - Lower Quadrant o Medihoney gel Secondary Dressing Wound #1 Left Abdomen - Lower Quadrant o Dry Gauze Dressing Change Frequency Wound #1 Left Abdomen - Lower Quadrant o Change dressing every day. Follow-up Appointments Wound #1 Left Abdomen - Lower Quadrant o Return Appointment in 2 weeks. - Tuesday 10/27/15 Home Health Wound #1 Left Abdomen - Lower Quadrant o Initiate Home Health for Skilled Nursing o Continue Home Health Visits o Home Health Nurse may visit PRN to address patientos wound care needs. o FACE TO FACE ENCOUNTER: MEDICARE and MEDICAID PATIENTS: I certify that this patient is under my care  and that I had a face-to-face encounter that meets the physician face-to-face encounter requirements with this patient on this date. The encounter with the patient was in whole or in part for the following MEDICAL CONDITION: (primary reason for Home Healthcare) YEILA, MORRO ([id number]) MEDICAL NECESSITY: I certify, that based on my findings, NURSING services are a medically necessary home health service. HOME BOUND STATUS: I certify that my clinical findings support that this patient is homebound (i.e., Due to illness or injury, pt requires aid of supportive devices such as crutches, cane, wheelchairs, walkers, the use of special transportation or the assistance of another person to leave their place of residence. There is a normal inability to leave the home and doing so requires considerable and taxing effort. Other absences are for medical  reasons / religious services and are infrequent or of short duration when for other reasons). o If current dressing causes regression in wound condition, may D/C ordered dressing product/s and apply Normal Saline Moist Dressing daily until next Wound Healing Center / Other MD appointment. Notify Wound Healing Center of regression in wound condition at [phone]. o Please direct any NON-WOUND related issues/requests for orders to patient's Primary Care Physician Electronic Signature(s) Signed: 10/16/2015 4:42:27 PM By: Evlyn Kanner MD, FACS Signed: 10/16/2015 5:39:33 PM By: Curtis Sites Entered By: Curtis Sites on 10/16/2015 15:13:43 Kristen Wallace, Kristen Wallace ([id number]) -------------------------------------------------------------------------------- Problem List Details Patient Name: SHANEY, DECKMAN 10/16/2015 2:00 Date of Service: PM Medical Record [id number] Number: Patient Account Number: [id number] 1926-02-04 (81 y.o. 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 Treating RN: Curtis Sites Date of Birth/Sex: Female) Other Clinician: Primary Care Physician: Ronnald Nian Referring Physician: Merri Brunette Physician/Extender: Tania Ade in Treatment: 0 Subjective Chief Complaint Information obtained from Patient Patient presents to the wound care center for a consult due non healing wound she had a nodule above the left lower quadrant colostomy for about 6 months which has now opened into an ulcer for the last week. History of Present Illness (HPI) The following HPI elements were documented for the patient's wound: Location: ulcerative area just above the colostomy opening in the left lower quadrant Quality: Patient reports No Pain. Severity: Patient states wound (s) are getting better. Duration: Patient has had the wound for > 6 months prior to seeking treatment at the wound center Context: The wound appeared gradually over time. it was a nodule initially and gradually broke down into an open ulcer. Modifying Factors: Other treatment(s) tried include: her PCP has put her on Augmentin  recently Associated Signs and Symptoms: Wound has no purlulent drainage and her colostomy is functioning fine Very pleasant 81 year old patient whose had a nodule just above the stoma and the left lower quadrant where she's had a colostomy for about 17-1/2 years. about a week ago the nodule opened into an ulcerated area and she was recently put on Augmentin for this. Colostomy is functioning fine and she has no issues with applying the bag. Her past medical history significant for diabetes mellitus type 2 SLE, OA, DDD, osteopenia, narcotic contract. Of note she is on chronic doses of prednisone which is 10 mg daily. She has never been a smoker. Wound History Patient presents with 1 open wound that has been present for approximately 1 week. Patient has been treating wound in the following manner: gauze. Laboratory tests have not been performed in the last month. Patient reportedly has not tested positive for an antibiotic resistant organism. Patient reportedly has not tested positive for osteomyelitis. Patient reportedly has not had testing performed to evaluate circulation in the legs. Patient History Information obtained from Patient. Allergies Bilotti, Doriann ([id number]) Betadine Social History Never  smoker, Marital Status - Widowed, Alcohol Use - Never, Drug Use - No History, Caffeine Use - Never. Medical History Cardiovascular Patient has history of Hypertension Endocrine Patient has history of Type II Diabetes Musculoskeletal Patient has history of Osteoarthritis Neurologic Patient has history of Neuropathy Oncologic Denies history of Received Chemotherapy, Received Radiation Patient is treated with Oral Agents. Medical And Surgical History Notes Ear/Nose/Mouth/Throat HOH - hearing aid Integumentary (Skin) SLE Neurologic SLE Review of Systems (ROS) Constitutional Symptoms (General Health) The patient has no complaints or symptoms. Eyes Complains or has symptoms of  Glasses / Contacts - glasses. Ear/Nose/Mouth/Throat The patient has no complaints or symptoms. Hematologic/Lymphatic The patient has no complaints or symptoms. Genitourinary Complains or has symptoms of Kidney failure/ Dialysis - CRI. Immunological The patient has no complaints or symptoms. Musculoskeletal The patient has no complaints or symptoms. Neurologic The patient has no complaints or symptoms. Oncologic The patient has no complaints or symptoms. Kristen Wallace, Kristen Wallace ([id number]) Medications oxycodone 7.5 mg tablet,oral ONLY (not for feeding tubes) oral 1 1 tablet, oral only oral Adult Probiotic 3 billion cell capsule oral 1 1 capsule oral Actos 15 mg tablet oral 1 1 tablet oral Micardis 40 mg tablet oral 1 1 tablet oral Tenormin 25 mg tablet oral tablet oral prednisone 5 mg tablet oral tablet oral multivitamin tablet oral 1 1 tablet oral Prilosec OTC 20 mg tablet,delayed release oral 1 1 tablet,delayed release (DR/EC) oral hydrochlorothiazide 12.5 mg tablet oral 1 1 tablet oral Objective Constitutional Pulse regular. Respirations normal and unlabored. Afebrile. Vitals Time Taken: 2:31 PM, Height: 65 in, Source: Stated, Weight: 165 lbs, Source: Stated, BMI: 27.5, Temperature: 98.2 F, Pulse: 57 bpm, Respiratory Rate: 18 breaths/min, Blood Pressure: 127/59 mmHg, Capillary Blood Glucose: 92 mg/dl. Eyes Nonicteric. Reactive to light. Ears, Nose, Mouth, and Throat Lips, teeth, and gums WNL.Marland Kitchen Moist mucosa without lesions . Neck supple and nontender. No palpable supraclavicular or cervical adenopathy. Normal sized without goiter. Respiratory WNL. No retractions.. Cardiovascular Pedal Pulses WNL. No clubbing, cyanosis or edema. Genitourinary (GU) No hydrocele, spermatocele, tenderness of the cord, or testicular mass.Marland Kitchen Penis without lesions.Renetta Chalk without lesions. No cystocele, or rectocele. Pelvic support intact, no discharge. Marland Kitchen Urethra without masses, tenderness or  scarring.Marland Kitchen Lymphatic No adneopathy. No adenopathy. No adenopathy. MAYLI, COVINGTON ([id number]) Musculoskeletal Adexa without tenderness or enlargement.. Digits and nails w/o clubbing, cyanosis, infection, petechiae, ischemia, or inflammatory conditions.Marland Kitchen Psychiatric Judgement and insight Intact.. No evidence of depression, anxiety, or agitation.. General Notes: She has a functioning colostomy with a stoma which looks excellent in the left lower quadrant. Just superior to this at the 12:00 position there is an ulcerated area with minimal slough and the edges show no signs of eversion or cellulitis.the minimal slough need sharp debridement with a curette Integumentary (Hair, Skin) No suspicious lesions. No crepitus or fluctuance. No peri-wound warmth or erythema. No masses.. Wound #1 status is Open. Original cause of wound was Gradually Appeared. The wound is located on the Left Abdomen - Lower Quadrant. The wound measures 1cm length x 1cm width x 0.2cm depth; 0.785cm^2 area and 0.157cm^3 volume. The wound is limited to skin breakdown. There is no tunneling or undermining noted. There is a small amount of serous drainage noted. The wound margin is flat and intact. There is no granulation within the wound bed. There is a large (67-100%) amount of necrotic tissue within the wound bed including Eschar and Adherent Slough. The periwound skin appearance did not exhibit: Callus, Crepitus, Excoriation, Fluctuance, Friable, Induration, Localized Edema, Rash,  Scarring, Dry/Scaly, Maceration, Moist, Atrophie Blanche, Cyanosis, Ecchymosis, Hemosiderin Staining, Mottled, Pallor, Rubor, Erythema. Assessment Active Problems ICD-10 E11.622 - Type 2 diabetes mellitus with other skin ulcer L98.499 - Non-pressure chronic ulcer of skin of other sites with unspecified severity K94.00 - Colostomy complication, unspecified This 81 year old patient who has well-controlled diabetes mellitus had a inflammatory lesion  just above her stoma in the left lower quadrant where she's had a colostomy for a long while. This is now a shallow ulcer with signs of acute ulceration and after debriding it I'm going to advise some medihoney and an occlusive dressing above this so as to help with autolytic debridement. She can finish her course of antibiotics, control her diabetes well and see me back just after Thanksgiving. management discussed with the patient and her daughter was at the bedside and they are in agreement. Kristen BakeHUDAK, Lilie ([id number]) Procedures Wound #1 Wound #1 is a To be determined located on the Left Abdomen - Lower Quadrant . There was a Skin/Subcutaneous Tissue Debridement ([id number]) debridement with total area of 1 sq cm performed by Evlyn KannerBritto, Brigham Cobbins, MD. with the following instrument(s): Curette to remove Viable and Non-Viable tissue/material including Fibrin/Slough, Eschar, and Subcutaneous after achieving pain control using Lidocaine 4% Topical Solution. A time out was conducted prior to the start of the procedure. A Minimum amount of bleeding was controlled with Pressure. The procedure was tolerated well with a pain level of 0 throughout and a pain level of 0 following the procedure. Post Debridement Measurements: 1cm length x 1cm width x 0.2cm depth; 0.157cm^3 volume. Post procedure Diagnosis Wound #1: Same as Pre-Procedure Plan Wound Cleansing: Wound #1 Left Abdomen - Lower Quadrant: Clean wound with Normal Saline. Anesthetic: Wound #1 Left Abdomen - Lower Quadrant: Topical Lidocaine 4% cream applied to wound bed prior to debridement Primary Wound Dressing: Wound #1 Left Abdomen - Lower Quadrant: Medihoney gel Secondary Dressing: Wound #1 Left Abdomen - Lower Quadrant: Dry Gauze Dressing Change Frequency: Wound #1 Left Abdomen - Lower Quadrant: Change dressing every day. Follow-up Appointments: Wound #1 Left Abdomen - Lower Quadrant: Return Appointment in 2 weeks. - Tuesday  10/27/15 Home Health: Wound #1 Left Abdomen - Lower Quadrant: Initiate Home Health for Skilled Nursing Continue Home Health Visits Home Health Nurse may visit PRN to address patient s wound care needs. FACE TO FACE ENCOUNTER: MEDICARE and MEDICAID PATIENTS: I certify that this patient is under my care and that I had a face-to-face encounter that meets the physician face-to-face encounter requirements with this patient on this date. The encounter with the patient was in whole or in part for the following MEDICAL CONDITION: (primary reason for Home Healthcare) MEDICAL NECESSITY: I certify, that based on my findings, NURSING services are a medically necessary home health service. HOME BOUND STATUS: I certify that my clinical findings support that this patient is homebound (i.e., Due to Kristen BakeHUDAK, Jacqueli ([id number]) illness or injury, pt requires aid of supportive devices such as crutches, cane, wheelchairs, walkers, the use of special transportation or the assistance of another person to leave their place of residence. There is a normal inability to leave the home and doing so requires considerable and taxing effort. Other absences are for medical reasons / religious services and are infrequent or of short duration when for other reasons). If current dressing causes regression in wound condition, may D/C ordered dressing product/s and apply Normal Saline Moist Dressing daily until next Wound Healing Center / Other MD appointment. Notify Wound Healing Center of regression in wound  condition at [phone]. Please direct any NON-WOUND related issues/requests for orders to patient's Primary Care Physician This 81 year old patient who has well-controlled diabetes mellitus had a inflammatory lesion just above her stoma in the left lower quadrant where she's had a colostomy for a long while. This is now a shallow ulcer with signs of acute ulceration and after debriding it I'm going to advise some medihoney  and an occlusive dressing above this so as to help with autolytic debridement. She can finish her course of antibiotics, control her diabetes well and see me back just after Thanksgiving. management discussed with the patient and her daughter was at the bedside and they are in agreement. Electronic Signature(s) Signed: 10/16/2015 3:45:37 PM By: Evlyn Kanner MD, FACS Entered By: Evlyn Kanner on 10/16/2015 15:45:37 CASSARA, NIDA ([id number]) -------------------------------------------------------------------------------- ROS/PFSH Details Patient Name: ADALEIGH, WARF 10/16/2015 2:00 Date of Service: PM Medical Record [id number] Number: Patient Account Number: [id number] 05/23/1926 (81 y.o. Treating RN: Curtis Sites Date of Birth/Sex: Female) Other Clinician: Primary Care Physician: Merri Brunette Treating Briar Sword Referring Physician: Merri Brunette Physician/Extender: Tania Ade in Treatment: 0 Information Obtained From Patient Wound History Do you currently have one or more open woundso Yes How many open wounds do you currently haveo 1 Approximately how long have you had your woundso 1 week How have you been treating your wound(s) until nowo gauze Has your wound(s) ever healed and then re-openedo No Have you had any lab work done in the past montho No Have you tested positive for an antibiotic resistant organism (MRSA, VRE)o No Have you tested positive for osteomyelitis (bone infection)o No Have you had any tests for circulation on your legso No Eyes Complaints and Symptoms: Positive for: Glasses / Contacts - glasses Genitourinary Complaints and Symptoms: Positive for: Kidney failure/ Dialysis - CRI Constitutional Symptoms (General Health) Complaints and Symptoms: No Complaints or Symptoms Ear/Nose/Mouth/Throat Complaints and Symptoms: No Complaints or Symptoms Medical History: Past Medical History Notes: HOH - hearing aid Hematologic/Lymphatic SHAMIKA, PEDREGON  ([id number]) Complaints and Symptoms: No Complaints or Symptoms Cardiovascular Medical History: Positive for: Hypertension Endocrine Medical History: Positive for: Type II Diabetes Treated with: Oral agents Immunological Complaints and Symptoms: No Complaints or Symptoms Integumentary (Skin) Medical History: Past Medical History Notes: SLE Musculoskeletal Complaints and Symptoms: No Complaints or Symptoms Medical History: Positive for: Osteoarthritis Neurologic Complaints and Symptoms: No Complaints or Symptoms Medical History: Positive for: Neuropathy Past Medical History Notes: SLE Oncologic Complaints and Symptoms: No Complaints or Symptoms Medical History: Negative for: Received Chemotherapy; Received Radiation JESSENIA, FILIPPONE ([id number]) Family and Social History Never smoker; Marital Status - Widowed; Alcohol Use: Never; Drug Use: No History; Caffeine Use: Never; Financial Concerns: No; Food, Clothing or Shelter Needs: No; Support System Lacking: No; Transportation Concerns: No; Advanced Directives: No; Patient does not want information on Advanced Directives Physician Affirmation I have reviewed and agree with the above information. Electronic Signature(s) Signed: 10/16/2015 2:53:52 PM By: Evlyn Kanner MD, FACS Signed: 10/16/2015 5:39:33 PM By: Curtis Sites Entered By: Evlyn Kanner on 10/16/2015 14:53:51 Kristen Wallace ([id number]) -------------------------------------------------------------------------------- SuperBill Details Patient Name: Kristen Wallace Date of Service: 10/16/2015 Medical Record Number: [id number] Patient Account Number: [id number] Date of Birth/Sex: 1925/12/17 (81 y.o. 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,  unspecified Facility Procedures CPT4 Code Description: 16109604 99214 - WOUND CARE VISIT-LEV 4 EST PT Modifier: Quantity: 1 CPT4 Code Description: 54098119 11042 - DEB SUBQ TISSUE 20 SQ CM/< ICD-10 Description Diagnosis E11.622 Type 2 diabetes mellitus with other skin ulcer 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 wi 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 Colostomy complication, unspecified Modifier: th unspecified Quantity: 1 severity Physician Procedures CPT4 Code Description: 1478295 99204 - WC PHYS LEVEL 4 - NEW PT ICD-10 Description Diagnosis E11.622 Type 2 diabetes mellitus with other skin ulcer 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

## 2015-10-18 NOTE — Progress Notes (Signed)
Kristen Wallace, Kristen Wallace ([id number]) Visit Report for 10/16/2015 Allergy List Details Patient Name: Kristen Wallace, Kristen Wallace Date of Service: 10/16/2015 2:00 PM Medical Record Number: [id number] Patient Account Number: [id number] Date of Birth/Sex: 1926/03/26 (81 y.o. Female) Treating RN: Curtis Sitesorthy, Joanna Primary Care Physician: Merri BrunettePHARR, WALTER Other Clinician: Referring Physician: Merri BrunettePHARR, WALTER Treating Physician/Extender: Rudene ReBritto, Errol Weeks in Treatment: 0 Allergies Active Allergies Betadine Allergy Notes Electronic Signature(s) Signed: 10/16/2015 5:39:33 PM By: Curtis Sitesorthy, Joanna Entered By: Curtis Sitesorthy, Joanna on 10/16/2015 14:33:54 Kristen Wallace, Kristen Wallace ([id number]) -------------------------------------------------------------------------------- Arrival Information Details Patient Name: Kristen Wallace, Kristen Wallace Date of Service: 10/16/2015 2:00 PM Medical Record Number: [id number] Patient Account Number: [id number] Date of Birth/Sex: 1926/03/26 (81 y.o. Female) Treating RN: Curtis Sitesorthy, Joanna Primary Care Physician: Merri BrunettePHARR, WALTER Other Clinician: Referring Physician: Merri BrunettePHARR, WALTER Treating Physician/Extender: Rudene ReBritto, Errol Weeks in Treatment: 0 Visit Information Patient Arrived: Walker Arrival Time: 14:29 Accompanied By: dtr Transfer Assistance: None Patient Identification Verified: Yes Secondary Verification Process Completed: Yes Patient Has Alerts: Yes Patient Alerts: DMII Electronic Signature(s) Signed: 10/16/2015 5:39:33 PM By: Curtis Sitesorthy, Joanna Entered By: Curtis Sitesorthy, Joanna on 10/16/2015 14:30:12 Kristen Wallace, Kristen Wallace ([id number]) -------------------------------------------------------------------------------- Clinic Level of Care Assessment Details Patient Name: Kristen Wallace, Kristen Wallace Date of Service: 10/16/2015 2:00 PM Medical Record Number: [id number] Patient Account Number: [id number] Date of Birth/Sex: 1926/03/26 (81 y.o. Female) Treating RN: Curtis Sitesorthy, Joanna Primary Care Physician: Merri BrunettePHARR, WALTER Other Clinician: Referring Physician: Merri BrunettePHARR,  WALTER Treating Physician/Extender: Rudene ReBritto, Errol Weeks in Treatment: 0 Clinic Level of Care Assessment Items TOOL 2 Quantity Score []  - Use when only an EandM is performed on the INITIAL visit 0 ASSESSMENTS - Nursing Assessment / Reassessment X - General Physical Exam (combine w/ comprehensive assessment (listed just 1 20 below) when performed on new pt. evals) X - Comprehensive Assessment (HX, ROS, Risk Assessments, Wounds Hx, etc.) 1 25 ASSESSMENTS - Wound and Skin Assessment / Reassessment X - Simple Wound Assessment / Reassessment - one wound 1 5 []  - Complex Wound Assessment / Reassessment - multiple wounds 0 []  - Dermatologic / Skin Assessment (not related to wound area) 0 ASSESSMENTS - Ostomy and/or Continence Assessment and Care []  - Incontinence Assessment and Management 0 []  - Ostomy Care Assessment and Management (repouching, etc.) 0 PROCESS - Coordination of Care X - Simple Patient / Family Education for ongoing care 1 15 []  - Complex (extensive) Patient / Family Education for ongoing care 0 X - Staff obtains ChiropractorConsents, Records, Test Results / Process Orders 1 10 []  - Staff telephones HHA, Nursing Homes / Clarify orders / etc 0 []  - Routine Transfer to another Facility (non-emergent condition) 0 []  - Routine Hospital Admission (non-emergent condition) 0 X - New Admissions / Manufacturing engineernsurance Authorizations / Ordering NPWT, Apligraf, etc. 1 15 []  - Emergency Hospital Admission (emergent condition) 0 X - Simple Discharge Coordination 1 10 Kristen Wallace ([id number]) []  - Complex (extensive) Discharge Coordination 0 PROCESS - Special Needs []  - Pediatric / Minor Patient Management 0 []  - Isolation Patient Management 0 []  - Hearing / Language / Visual special needs 0 []  - Assessment of Community assistance (transportation, D/C planning, etc.) 0 []  - Additional assistance / Altered mentation 0 []  - Support Surface(s) Assessment (bed, cushion, seat, etc.) 0 INTERVENTIONS - Wound  Cleansing / Measurement X - Wound Imaging (photographs - any number of wounds) 1 5 []  - Wound Tracing (instead of photographs) 0 X - Simple Wound Measurement - one wound 1 5 []  - Complex Wound Measurement - multiple wounds 0 X - Simple Wound Cleansing - one wound 1 5 []  - Complex Wound  Cleansing - multiple wounds 0 INTERVENTIONS - Wound Dressings X - Small Wound Dressing one or multiple wounds 1 10  - Medium Wound Dressing one or multiple wounds 0  - Large Wound Dressing one or multiple wounds 0  - Application of Medications - injection 0 INTERVENTIONS - Miscellaneous  - External ear exam 0  - Specimen Collection (cultures, biopsies, blood, body fluids, etc.) 0  - Specimen(s) / Culture(s) sent or taken to Lab for analysis 0  - Patient Transfer (multiple staff / Michiel Sites Lift / Similar devices) 0  - Simple Staple / Suture removal (25 or less) 0  - Complex Staple / Suture removal (26 or more) 0 Kristen Wallace ([id number])  - Hypo / Hyperglycemic Management (close monitor of Blood Glucose) 0  - Ankle / Brachial Index (ABI) - do not check if billed separately 0 Has the patient been seen at the hospital within the last three years: Yes Total Score: 125 Level Of Care: New/Established - Level 4 Electronic Signature(s) Signed: 10/16/2015 5:39:33 PM By: Curtis Sites Entered By: Curtis Sites on 10/16/2015 15:08:25 Kristen Wallace ([id number]) -------------------------------------------------------------------------------- Encounter Discharge Information Details Patient Name: Kristen Wallace Date of Service: 10/16/2015 2:00 PM Medical Record Number: [id number] Patient Account Number: [id number] Date of Birth/Sex: May 16, 1926 (81 y.o. Female) Treating RN: Curtis Sites Primary Care Physician: Merri Brunette Other Clinician: Referring Physician: Merri Brunette Treating Physician/Extender: Rudene Re in Treatment: 0 Encounter Discharge Information Items Discharge Pain  Level: 0 Discharge Condition: Stable Ambulatory Status: Walker Discharge Destination: Home Transportation: Private Auto Accompanied By: dtr Schedule Follow-up Appointment: Yes Medication Reconciliation completed and provided to Patient/Care No Louay Myrie: Provided on Clinical Summary of Care: 10/16/2015 Form Type Recipient Paper Patient [facility] Electronic Signature(s) Signed: 10/16/2015 3:30:44 PM By: Gwenlyn Perking Entered By: Gwenlyn Perking on 10/16/2015 15:30:44 Kristen Wallace ([id number]) -------------------------------------------------------------------------------- Multi Wound Chart Details Patient Name: Kristen Wallace Date of Service: 10/16/2015 2:00 PM Medical Record Number: [id number] Patient Account Number: [id number] Date of Birth/Sex: [birth year] (81 y.o. Female) Treating RN: Curtis Sites Primary Care Physician: Merri Brunette Other Clinician: Referring Physician: Merri Brunette Treating Physician/Extender: Rudene Re in Treatment: 0 Vital Signs Height(in): 65 Capillary Blood 92 Glucose(mg/dl): Weight(lbs): 244 Pulse(bpm): 57 Body Mass Index(BMI): 27 Blood Pressure Temperature(F): 98.2 127/59 (mmHg): Respiratory Rate 18 (breaths/min): Photos: [1:No Photos] [N/A:N/A] Wound Location: [1:Left Abdomen - Lower Quadrant] [N/A:N/A] Wounding Event: [1:Gradually Appeared] [N/A:N/A] Primary Etiology: [1:To be determined] [N/A:N/A] Comorbid History: [1:Hypertension, Type II Diabetes, Osteoarthritis, Neuropathy] [N/A:N/A] Date Acquired: [1:10/09/2015] [N/A:N/A] Weeks of Treatment: [1:0] [N/A:N/A] Wound Status: [1:Open] [N/A:N/A] Measurements L x W x D 1x1x0.2 [N/A:N/A] (cm) Area (cm) : [1:0.785] [N/A:N/A] Volume (cm) : [1:0.157] [N/A:N/A] Classification: [1:Full Thickness Without Exposed Support Structures] [N/A:N/A] Exudate Amount: [1:Small] [N/A:N/A] Exudate Type: [1:Serous] [N/A:N/A] Exudate Color: [1:amber] [N/A:N/A] Wound Margin: [1:Flat and Intact]  [N/A:N/A] Granulation Amount: [1:None Present (0%)] [N/A:N/A] Necrotic Amount: [1:Large (67-100%)] [N/A:N/A] Necrotic Tissue: [1:Eschar, Adherent Slough] [N/A:N/A] Exposed Structures: [1:Fascia: No Fat: No Tendon: No Muscle: No Joint: No] [N/A:N/A] Bone: No Limited to Skin Breakdown Epithelialization: None N/A N/A Periwound Skin Texture: Edema: No N/A N/A Excoriation: No Induration: No Callus: No Crepitus: No Fluctuance: No Friable: No Rash: No Scarring: No Periwound Skin Maceration: No N/A N/A Moisture: Moist: No Dry/Scaly: No Periwound Skin Color: Atrophie Blanche: No N/A N/A Cyanosis: No Ecchymosis: No Erythema: No Hemosiderin Staining: No Mottled: No Pallor: No Rubor: No Tenderness on No N/A N/A Palpation: Wound Preparation: Ulcer Cleansing: N/A N/A Rinsed/Irrigated with Saline Topical Anesthetic Applied: Other: lidocaine 4% Treatment  Notes Electronic Signature(s) Signed: 10/16/2015 5:39:33 PM By: Curtis Sites Entered By: Curtis Sites on 10/16/2015 15:02:19 Kristen Wallace ([id number]) -------------------------------------------------------------------------------- Multi-Disciplinary Care Plan Details Patient Name: Kristen Wallace Date of Service: 10/16/2015 2:00 PM Medical Record Number: [id number] Patient Account Number: [id number] Date of Birth/Sex: 23-Jul-1926 (81 y.o. Female) Treating RN: Curtis Sites Primary Care Physician: Merri Brunette Other Clinician: Referring Physician: Merri Brunette Treating Physician/Extender: Rudene Re in Treatment: 0 Active Inactive Abuse / Safety / Falls / Self Care Management Nursing Diagnoses: Impaired physical mobility Potential for falls Goals: Patient will remain injury free Date Initiated: 10/16/2015 Goal Status: Active Interventions: Assess fall risk on admission and as needed Notes: Orientation to the Wound Care Program Nursing Diagnoses: Knowledge deficit related to the wound healing center  program Goals: Patient/caregiver will verbalize understanding of the Wound Healing Center Program Date Initiated: 10/16/2015 Goal Status: Active Interventions: Provide education on orientation to the wound center Notes: Wound/Skin Impairment Nursing Diagnoses: Impaired tissue integrity Goals: Patient/caregiver will verbalize understanding of skin care regimen ERYKAH, LIPPERT ([id number]) Date Initiated: 10/16/2015 Goal Status: Active Ulcer/skin breakdown will have a volume reduction of 30% by week 4 Date Initiated: 10/16/2015 Goal Status: Active Ulcer/skin breakdown will have a volume reduction of 50% by week 8 Date Initiated: 10/16/2015 Goal Status: Active Ulcer/skin breakdown will have a volume reduction of 80% by week 12 Date Initiated: 10/16/2015 Goal Status: Active Ulcer/skin breakdown will heal within 14 weeks Date Initiated: 10/16/2015 Goal Status: Active Interventions: Assess ulceration(s) every visit Notes: Electronic Signature(s) Signed: 10/16/2015 5:39:33 PM By: Curtis Sites Entered By: Curtis Sites on 10/16/2015 15:02:00 Kristen Wallace ([id number]) -------------------------------------------------------------------------------- Patient/Caregiver Education Details Patient Name: Kristen Wallace Date of Service: 10/16/2015 2:00 PM Medical Record Number: [id number] Patient Account Number: [id number] Date of Birth/Gender: 08/21/1926 (81 y.o. Female) Treating RN: Curtis Sites Primary Care Physician: Merri Brunette Other Clinician: Referring Physician: Merri Brunette Treating Physician/Extender: Rudene Re in Treatment: 0 Education Assessment Education Provided To: Patient and Caregiver Education Topics Provided Wound/Skin Impairment: Handouts: Other: wound care as ordered Methods: Demonstration, Explain/Verbal Responses: State content correctly Electronic Signature(s) Signed: 10/16/2015 5:39:33 PM By: Curtis Sites Entered By: Curtis Sites on  10/16/2015 15:35:54 Kristen Wallace ([id number]) -------------------------------------------------------------------------------- Wound Assessment Details Patient Name: Kristen Wallace Date of Service: 10/16/2015 2:00 PM Medical Record Number: [id number] Patient Account Number: [id number] Date of Birth/Sex: 1926-07-13 (81 y.o. Female) Treating RN: Curtis Sites Primary Care Physician: Merri Brunette Other Clinician: Referring Physician: Merri Brunette Treating Physician/Extender: Rudene Re in Treatment: 0 Wound Status Wound Number: 1 Primary To be determined Etiology: Wound Location: Left Abdomen - Lower Quadrant Wound Open Status: Wounding Event: Gradually Appeared Comorbid Hypertension, Type II Diabetes, Date Acquired: 10/09/2015 History: Osteoarthritis, Neuropathy Weeks Of Treatment: 0 Clustered Wound: No Photos Photo Uploaded By: Elliot Gurney, RN, BSN, Kim on 10/16/2015 17:29:33 Wound Measurements Length: (cm) 1 Width: (cm) 1 Depth: (cm) 0.2 Area: (cm) 0.785 Volume: (cm) 0.157 % Reduction in Area: % Reduction in Volume: Epithelialization: None Tunneling: No Undermining: No Wound Description Full Thickness Without Exposed Classification: Support Structures Wound Margin: Flat and Intact Exudate Small Amount: Exudate Type: Serous Exudate Color: amber Foul Odor After Cleansing: No Wound Bed Granulation Amount: None Present (0%) Exposed Structure Necrotic Amount: Large (67-100%) Fascia Exposed: No Borkowski, Brynja ([id number]) Necrotic Quality: Eschar, Adherent Slough Fat Layer Exposed: No Tendon Exposed: No Muscle Exposed: No Joint Exposed: No Bone Exposed: No Limited to Skin Breakdown Periwound Skin Texture Texture Color No Abnormalities Noted: No No Abnormalities Noted: No Callus: No Atrophie Blanche: No  Crepitus: No Cyanosis: No Excoriation: No Ecchymosis: No Fluctuance: No Erythema: No Friable: No Hemosiderin Staining: No Induration:  No Mottled: No Localized Edema: No Pallor: No Rash: No Rubor: No Scarring: No Moisture No Abnormalities Noted: No Dry / Scaly: No Maceration: No Moist: No Wound Preparation Ulcer Cleansing: Rinsed/Irrigated with Saline Topical Anesthetic Applied: Other: lidocaine 4%, Treatment Notes Wound #1 (Left Abdomen - Lower Quadrant) 1. Cleansed with: Clean wound with Normal Saline 2. Anesthetic Topical Lidocaine 4% cream to wound bed prior to debridement 4. Dressing Applied: Medihoney Gel 5. Secondary Dressing Applied Dry Gauze 7. Secured with Secretary/administrator) Signed: 10/16/2015 5:39:33 PM By: Curtis Sites Entered By: Curtis Sites on 10/16/2015 14:47:05 VIRGENE, TIRONE ([id number]) AURILLA, COULIBALY ([id number]) -------------------------------------------------------------------------------- Vitals Details Patient Name: Kristen Wallace Date of Service: 10/16/2015 2:00 PM Medical Record Number: [id number] Patient Account Number: [id number] Date of Birth/Sex: [birth year] (81 y.o. Female) Treating RN: Curtis Sites Primary Care Physician: Merri Brunette Other Clinician: Referring Physician: Merri Brunette Treating Physician/Extender: Rudene Re in Treatment: 0 Vital Signs Time Taken: 14:31 Temperature (F): 98.2 Height (in): 65 Pulse (bpm): 57 Source: Stated Respiratory Rate (breaths/min): 18 Weight (lbs): 165 Blood Pressure (mmHg): 127/59 Source: Stated Capillary Blood Glucose (mg/dl): 92 Body Mass Index (BMI): 27.5 Reference Range: 80 - 120 mg / dl Electronic Signature(s) Signed: 10/16/2015 5:39:33 PM By: Curtis Sites Entered By: Curtis Sites on 10/16/2015 14:33:35

## 2015-10-27 ENCOUNTER — Encounter: Payer: Medicare Other | Admitting: Surgery

## 2015-10-27 DIAGNOSIS — E11622 Type 2 diabetes mellitus with other skin ulcer: Secondary | ICD-10-CM | POA: Diagnosis not present

## 2015-10-27 NOTE — Progress Notes (Addendum)
TY, OSHIMA ([id number]) Visit Report for 10/27/2015 Chief Complaint Document Details Patient Name: Kristen Wallace, Kristen Wallace 10/27/2015 2:45 Date of Service: PM Medical Record [id number] Number: Patient Account Number: [id number] 1926-01-19 (81 y.o. Treating RN: Curtis Sites Date of Birth/Sex: Female) Other Clinician: Primary Care Physician: Merri Brunette Treating Evlyn Kanner Referring Physician: Merri Brunette Physician/Extender: Tania Ade in Treatment: 1 Information Obtained from: Patient Chief Complaint Patient presents to the wound care center for a consult due non healing wound she had a nodule above the left lower quadrant colostomy for about 6 months which has now opened into an ulcer for the last week. Electronic Signature(s) Signed: 10/27/2015 3:44:37 PM By: Evlyn Kanner MD, FACS Entered By: Evlyn Kanner on 10/27/2015 15:44:37 Kristen Wallace, Kristen Wallace ([id number]) -------------------------------------------------------------------------------- HPI Details Patient Name: Kristen Wallace 10/27/2015 2:45 Date of Service: PM Medical Record [id number] Number: Patient Account Number: [id number] [birth year] (81 y.o. Treating RN: Curtis Sites Date of Birth/Sex: Female) Other Clinician: Primary Care Physician: Merri Brunette Treating Evlyn Kanner Referring Physician: Merri Brunette Physician/Extender: Weeks in Treatment: 1 History of Present Illness Location: ulcerative area just above the colostomy opening in the left lower quadrant Quality: Patient reports No Pain. Severity: Patient states wound (s) are getting better. Duration: Patient has had the wound for > 6 months prior to seeking treatment at the wound center Context: The wound appeared gradually over time. it was a nodule initially and gradually broke down into an open ulcer. Modifying Factors: Other treatment(s) tried include: her PCP has put her on Augmentin recently Associated Signs and Symptoms: Wound has no purlulent drainage and her  colostomy is functioning fine HPI Description: Very pleasant 81 year old patient whose had a nodule just above the stoma and the left lower quadrant where she's had a colostomy for about 17-1/2 years. About a week ago the nodule opened into an ulcerated area and she was recently put on Augmentin for this. Colostomy is functioning fine and she has no issues with applying the bag. Her past medical history significant for diabetes mellitus type 2 SLE, OA, DDD, osteopenia, narcotic contract. Of note she is on chronic doses of prednisone which is 10 mg daily. She has never been a smoker. 10/27/2015 -- She had a culture from a wound which grew staph aureus which is MSSA. Her other labs sent and showed a WC count of 5.2 with hemoglobin of 12.2 hematocrit of 37.3 and platelets of 154. The patient also had her BMP done which showed slight elevation of her BUN/creatinine and her glucose was 167. Hemoglobin A1c was 5.6 Of note her supplies did not arrive and she has not been using Medihoney on her wound. Unfortunately they did not call us to let is know the lack of supplies. We will work with the vendor today to make certain that these reach her ASAP. Electronic Signature(s) Signed: 10/27/2015 3:45:49 PM By: Evlyn Kanner MD, FACS Previous Signature: 10/27/2015 3:01:16 PM Version By: Evlyn Kanner MD, FACS Previous Signature: 10/27/2015 2:59:53 PM Version By: Evlyn Kanner MD, FACS Entered By: Evlyn Kanner on 10/27/2015 15:45:49 Kristen Wallace, Kristen Wallace ([id number]) -------------------------------------------------------------------------------- Physical Exam Details Patient Name: Kristen Wallace, Kristen Wallace 10/27/2015 2:45 Date of Service: PM Medical Record [id number] Number: Patient Account Number: [id number] 1926/03/21 (81 y.o. Treating RN: Curtis Sites Date of Birth/Sex: Female) Other Clinician: Primary Care Physician: Merri Brunette Treating Evlyn Kanner Referring Physician: Merri Brunette Physician/Extender: Weeks in Treatment: 1 Constitutional . Pulse regular. Respirations normal and unlabored. Afebrile. . Eyes Nonicteric. Reactive to light. Ears, Nose, Mouth, and Throat Lips, teeth, and gums WNL.Marland Kitchen Moist  mucosa without lesions . Neck supple and nontender. No palpable supraclavicular or cervical adenopathy. Normal sized without goiter. Respiratory WNL. No retractions.. Cardiovascular Pedal Pulses WNL. No clubbing, cyanosis or edema. Chest Breasts symmetical and no nipple discharge.. Breast tissue WNL, no masses, lumps, or tenderness.. Lymphatic No adneopathy. No adenopathy. No adenopathy. Musculoskeletal Adexa without tenderness or enlargement.. Digits and nails w/o clubbing, cyanosis, infection, petechiae, ischemia, or inflammatory conditions.. Integumentary (Hair, Skin) No suspicious lesions. No crepitus or fluctuance. No peri-wound warmth or erythema. No masses.Marland Kitchen Psychiatric Judgement and insight Intact.. No evidence of depression, anxiety, or agitation.. Notes the stoma is looking normal and the ulcer just superior to the stoma at the 12:00 position has minimal slough and no sharp debridement was done today. Electronic Signature(s) Signed: 10/27/2015 3:46:25 PM By: Evlyn Kanner MD, FACS Entered By: Evlyn Kanner on 10/27/2015 15:46:24 Kristen Wallace ([id number]) -------------------------------------------------------------------------------- Physician Orders Details Patient Name: Kristen Wallace, Kristen Wallace 10/27/2015 2:45 Date of Service: PM Medical Record [id number] Number: Patient Account Number: [id number] 05/14/1926 (81 y.o. Treating RN: Curtis Sites Date of Birth/Sex: Female) Other Clinician: Primary Care Physician: Ronnald Nian Referring Physician: Merri Brunette Physician/Extender: Tania Ade in Treatment: 1 Verbal / Phone Orders: Yes Clinician: Curtis Sites Read Back and Verified: Yes Diagnosis Coding Wound Cleansing Wound  #1 Left Abdomen - Lower Quadrant o Clean wound with Normal Saline. Anesthetic Wound #1 Left Abdomen - Lower Quadrant o Topical Lidocaine 4% cream applied to wound bed prior to debridement Primary Wound Dressing Wound #1 Left Abdomen - Lower Quadrant o Medihoney gel Secondary Dressing Wound #1 Left Abdomen - Lower Quadrant o Dry Gauze Dressing Change Frequency Wound #1 Left Abdomen - Lower Quadrant o Change dressing every day. Follow-up Appointments Wound #1 Left Abdomen - Lower Quadrant o Return Appointment in 1 week. Home Health Wound #1 Left Abdomen - Lower Quadrant o Continue Home Health Visits o Home Health Nurse may visit PRN to address patientos wound care needs. o FACE TO FACE ENCOUNTER: MEDICARE and MEDICAID PATIENTS: I certify that this patient is under my care and that I had a face-to-face encounter that meets the physician face-to-face encounter requirements with this patient on this date. The encounter with the patient was in whole or in part for the following MEDICAL CONDITION: (primary reason for Home Healthcare) MEDICAL NECESSITY: I certify, that based on my findings, NURSING services are a medically BRECKLYN, GALVIS ([id number]) necessary home health service. HOME BOUND STATUS: I certify that my clinical findings support that this patient is homebound (i.e., Due to illness or injury, pt requires aid of supportive devices such as crutches, cane, wheelchairs, walkers, the use of special transportation or the assistance of another person to leave their place of residence. There is a normal inability to leave the home and doing so requires considerable and taxing effort. Other absences are for medical reasons / religious services and are infrequent or of short duration when for other reasons). o If current dressing causes regression in wound condition, may D/C ordered dressing product/s and apply Normal Saline Moist Dressing daily until next Wound Healing  Center / Other MD appointment. Notify Wound Healing Center of regression in wound condition at [phone]. o Please direct any NON-WOUND related issues/requests for orders to patient's Primary Care Physician Electronic Signature(s) Signed: 10/27/2015 5:43:56 PM By: Curtis Sites Signed: 10/27/2015 5:47:05 PM By: Evlyn Kanner MD, FACS Entered By: Curtis Sites on 10/27/2015 15:24:09 Kristen Wallace, Kristen Wallace ([id number]) -------------------------------------------------------------------------------- Problem List Details Patient Name: ELVERTA, DIMICELI 10/27/2015 2:45 Date of Service: PM Medical  Record [id number] Number: Patient Account Number: [id number] December 29, 1925 (81 y.o. 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 Treating RN: Curtis Sites Date of Birth/Sex: Female) Other Clinician: Primary Care Physician: Ronnald Nian Referring Physician: Merri Brunette Physician/Extender: Tania Ade in Treatment: 1 Subjective Chief Complaint Information obtained from  Patient Patient presents to the wound care center for a consult due non healing wound she had a nodule above the left lower quadrant colostomy for about 6 months which has now opened into an ulcer for the last week. History of Present Illness (HPI) The following HPI elements were documented for the patient's wound: Location: ulcerative area just above the colostomy opening in the left lower quadrant Quality: Patient reports No Pain. Severity: Patient states wound (s) are getting better. Duration: Patient has had the wound for > 6 months prior to seeking treatment at the wound center Context: The wound appeared gradually over time. it was a nodule initially and gradually broke down into an open ulcer. Modifying Factors: Other treatment(s) tried include: her PCP has put her on Augmentin recently Associated Signs and Symptoms: Wound has no purlulent drainage and her colostomy is functioning fine Very pleasant 81 year old patient whose had a nodule just above the stoma and the left lower quadrant where she's had a colostomy for about 17-1/2 years. About a week ago the nodule opened into an ulcerated area and she was recently put on Augmentin for this. Colostomy is functioning fine and she has no issues with applying the bag. Her past medical history significant for diabetes mellitus type 2 SLE, OA, DDD, osteopenia, narcotic contract. Of note she is on chronic doses of prednisone which is 10 mg daily. She has never been a smoker. 10/27/2015 -- She had a culture from a wound which grew staph aureus which is MSSA. Her other labs sent and showed a WC count of 5.2 with hemoglobin of 12.2 hematocrit of 37.3 and platelets of 154. The patient also had her BMP done which showed slight elevation of her BUN/creatinine and her glucose was 167. Hemoglobin A1c was 5.6 Of note her supplies did not arrive and she has not been using Medihoney on her wound. Unfortunately they did not call us to let is know the  lack of supplies. We will work with the vendor today to make certain that these reach her ASAP. Kristen Wallace, Kristen Wallace ([id number]) Objective Constitutional Pulse regular. Respirations normal and unlabored. Afebrile. Vitals Time Taken: 2:55 PM, Height: 65 in, Weight: 165 lbs, BMI: 27.5, Temperature: 98.4 F, Pulse: 53 bpm, Respiratory Rate: 18 breaths/min, Blood Pressure: 153/45 mmHg. Eyes Nonicteric. Reactive to light. Ears, Nose, Mouth, and Throat Lips, teeth, and gums WNL.Marland Kitchen Moist mucosa without lesions . Neck supple and nontender. No palpable supraclavicular or cervical adenopathy. Normal sized without goiter. Respiratory WNL. No retractions.. Cardiovascular Pedal Pulses WNL. No clubbing, cyanosis or edema. Chest Breasts  symmetical and no nipple discharge.. Breast tissue WNL, no masses, lumps, or tenderness.. Lymphatic No adneopathy. No adenopathy. No adenopathy. Musculoskeletal Adexa without tenderness or enlargement.. Digits and nails w/o clubbing, cyanosis, infection, petechiae, ischemia, or inflammatory conditions.Marland Kitchen Psychiatric Judgement and insight Intact.. No evidence of depression, anxiety, or agitation.. General Notes: the stoma is looking normal and the ulcer just superior to the stoma at the 12:00 position has minimal slough and no sharp debridement was done today. Integumentary (Hair, Skin) No suspicious lesions. No crepitus or fluctuance. No peri-wound warmth or erythema. No masses.. Wound #1 status is Open. Original cause of wound was Gradually Appeared. The wound is located on the Left Abdomen - Lower Quadrant. The wound measures 1cm length x 0.9cm width x 0.1cm depth; 0.707cm^2 area and 0.071cm^3 volume. The wound is limited to skin breakdown. There is no tunneling or Cassel, Tyanna ([id number]) undermining noted. There is a small amount of serous drainage noted. The wound margin is flat and intact. There is medium (34-66%) red granulation within the wound bed. There is a  medium (34-66%) amount of necrotic tissue within the wound bed including Eschar and Adherent Slough. The periwound skin appearance did not exhibit: Callus, Crepitus, Excoriation, Fluctuance, Friable, Induration, Localized Edema, Rash, Scarring, Dry/Scaly, Maceration, Moist, Atrophie Blanche, Cyanosis, Ecchymosis, Hemosiderin Staining, Mottled, Pallor, Rubor, Erythema. Assessment Active Problems ICD-10 E11.622 - Type 2 diabetes mellitus with other skin ulcer L98.499 - Non-pressure chronic ulcer of skin of other sites with unspecified severity K94.00 - Colostomy complication, unspecified I'm going to advise some medihoney and an occlusive dressing above this so as to help with autolytic debridement. Wound care has been discussed with her in detail and she has been taking good care of her colostomy. She will come back and see as next week. Plan Wound Cleansing: Wound #1 Left Abdomen - Lower Quadrant: Clean wound with Normal Saline. Anesthetic: Wound #1 Left Abdomen - Lower Quadrant: Topical Lidocaine 4% cream applied to wound bed prior to debridement Primary Wound Dressing: Wound #1 Left Abdomen - Lower Quadrant: Medihoney gel Secondary Dressing: Wound #1 Left Abdomen - Lower Quadrant: Dry Gauze Dressing Change Frequency: Wound #1 Left Abdomen - Lower Quadrant: Change dressing every day. Follow-up Appointments: Wound #1 Left Abdomen - Lower QuadrantKEEVA, Kristen Wallace ([id number]) Return Appointment in 1 week. Home Health: Wound #1 Left Abdomen - Lower Quadrant: Continue Home Health Visits Home Health Nurse may visit PRN to address patient s wound care needs. FACE TO FACE ENCOUNTER: MEDICARE and MEDICAID PATIENTS: I certify that this patient is under my care and that I had a face-to-face encounter that meets the physician face-to-face encounter requirements with this patient on this date. The encounter with the patient was in whole or in part for the following MEDICAL CONDITION:  (primary reason for Home Healthcare) MEDICAL NECESSITY: I certify, that based on my findings, NURSING services are a medically necessary home health service. HOME BOUND STATUS: I certify that my clinical findings support that this patient is homebound (i.e., Due to illness or injury, pt requires aid of supportive devices such as crutches, cane, wheelchairs, walkers, the use of special transportation or the assistance of another person to leave their place of residence. There is a normal inability to leave the home and doing so requires considerable and taxing effort. Other absences are for medical reasons / religious services and are infrequent or of short duration when for other reasons). If current dressing causes regression in wound condition, may D/C ordered dressing product/s and apply Normal  Saline Moist Dressing daily until next Wound Healing Center / Other MD appointment. Notify Wound Healing Center of regression in wound condition at [phone]. Please direct any NON-WOUND related issues/requests for orders to patient's Primary Care Physician I'm going to advise some medihoney and an occlusive dressing above this so as to help with autolytic debridement. Wound care has been discussed with her in detail and she has been taking good care of her colostomy. She will come back and see as next week. Electronic Signature(s) Signed: 10/27/2015 3:47:43 PM By: Evlyn KannerBritto, Carylon Tamburro MD, FACS Entered By: Evlyn KannerBritto, Honore Wipperfurth on 10/27/2015 15:47:43 Kristen Wallace, Kristen Wallace ([id number]) -------------------------------------------------------------------------------- SuperBill Details Patient Name: Kristen Wallace, Eun Date of Service: 10/27/2015 Medical Record Number: [id number] Patient Account Number: [id number] Date of Birth/Sex: 1926-04-01 (81 y.o. 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 K94.00 Colostomy complication, unspecified Facility Procedures CPT4 Code: [id number] Description: [id number] - WOUND CARE VISIT-LEV 2 EST PT Modifier: Quantity: 1 Physician Procedures CPT4 Code Description: [id number] - WC PHYS LEVEL 3 - EST PT ICD-10 Description Diagnosis E11.622 Type 2 diabetes mellitus with other skin ulcer 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

## 2015-10-28 NOTE — Progress Notes (Signed)
Kristen Wallace ([id number]) Visit Report for 10/27/2015 Arrival Information Details Patient Name: Kristen Wallace, Kristen Wallace Date of Service: 10/27/2015 2:45 PM Medical Record Number: [id number] Patient Account Number: [id number] Date of Birth/Sex: [birth year] (81 y.o. Female) Treating RN: Curtis Sites Primary Care Physician: Merri Brunette Other Clinician: Referring Physician: Merri Brunette Treating Physician/Extender: Rudene Re in Treatment: 1 Visit Information History Since Last Visit Added or deleted any medications: No Patient Arrived: Walker Any new allergies or adverse reactions: No Arrival Time: 14:58 Had a fall or experienced change in No Accompanied By: dtr activities of daily living that may affect Transfer Assistance: None risk of falls: Patient Identification Verified: Yes Signs or symptoms of abuse/neglect since last No Secondary Verification Process Completed: Yes visito Patient Has Alerts: Yes Hospitalized since last visit: No Patient Alerts: DMII Pain Present Now: No Electronic Signature(s) Signed: 10/27/2015 5:43:56 PM By: Curtis Sites Entered By: Curtis Sites on 10/27/2015 14:59:06 Kristen Wallace ([id number]) -------------------------------------------------------------------------------- Clinic Level of Care Assessment Details Patient Name: Kristen Wallace Date of Service: 10/27/2015 2:45 PM Medical Record Number: [id number] Patient Account Number: [id number] Date of Birth/Sex: Feb 22, 1926 (81 y.o. Female) Treating RN: Curtis Sites Primary Care Physician: Merri Brunette Other Clinician: Referring Physician: Merri Brunette Treating Physician/Extender: Rudene Re in Treatment: 1 Clinic Level of Care Assessment Items TOOL 4 Quantity Score  - Use when only an EandM is performed on FOLLOW-UP visit 0 ASSESSMENTS - Nursing Assessment / Reassessment X - Reassessment of Co-morbidities (includes updates in patient status) 1 10 X - Reassessment of Adherence  to Treatment Plan 1 5 ASSESSMENTS - Wound and Skin Assessment / Reassessment X - Simple Wound Assessment / Reassessment - one wound 1 5  - Complex Wound Assessment / Reassessment - multiple wounds 0  - Dermatologic / Skin Assessment (not related to wound area) 0 ASSESSMENTS - Focused Assessment  - Circumferential Edema Measurements - multi extremities 0  - Nutritional Assessment / Counseling / Intervention 0  - Lower Extremity Assessment (monofilament, tuning fork, pulses) 0  - Peripheral Arterial Disease Assessment (using hand held doppler) 0 ASSESSMENTS - Ostomy and/or Continence Assessment and Care  - Incontinence Assessment and Management 0  - Ostomy Care Assessment and Management (repouching, etc.) 0 PROCESS - Coordination of Care X - Simple Patient / Family Education for ongoing care 1 15  - Complex (extensive) Patient / Family Education for ongoing care 0  - Staff obtains Chiropractor, Records, Test Results / Process Orders 0  - Staff telephones HHA, Nursing Homes / Clarify orders / etc 0  - Routine Transfer to another Facility (non-emergent condition) 0 Atkin, Raena ([id number])  - Routine Hospital Admission (non-emergent condition) 0  - New Admissions / Manufacturing engineer / Ordering NPWT, Apligraf, etc. 0  - Emergency Hospital Admission (emergent condition) 0 X - Simple Discharge Coordination 1 10  - Complex (extensive) Discharge Coordination 0 PROCESS - Special Needs  - Pediatric / Minor Patient Management 0  - Isolation Patient Management 0  - Hearing / Language / Visual special needs 0  - Assessment of Community assistance (transportation, D/C planning, etc.) 0  - Additional assistance / Altered mentation 0  - Support Surface(s) Assessment (bed, cushion, seat, etc.) 0 INTERVENTIONS - Wound Cleansing / Measurement X - Simple Wound Cleansing - one wound 1 5  - Complex Wound Cleansing - multiple wounds 0 X - Wound Imaging  (photographs - any number of wounds) 1 5  - Wound Tracing (instead of photographs) 0 X - Simple Wound Measurement - one wound 1  5 []  - Complex Wound Measurement - multiple wounds 0 INTERVENTIONS - Wound Dressings X - Small Wound Dressing one or multiple wounds 1 10 []  - Medium Wound Dressing one or multiple wounds 0 []  - Large Wound Dressing one or multiple wounds 0 []  - Application of Medications - topical 0 []  - Application of Medications - injection 0 INTERVENTIONS - Miscellaneous []  - External ear exam 0 Markie, My ([id number]) []  - Specimen Collection (cultures, biopsies, blood, body fluids, etc.) 0 []  - Specimen(s) / Culture(s) sent or taken to Lab for analysis 0 []  - Patient Transfer (multiple staff / Michiel Sites Lift / Similar devices) 0 []  - Simple Staple / Suture removal (25 or less) 0 []  - Complex Staple / Suture removal (26 or more) 0 []  - Hypo / Hyperglycemic Management (close monitor of Blood Glucose) 0 []  - Ankle / Brachial Index (ABI) - do not check if billed separately 0 X - Vital Signs 1 5 Has the patient been seen at the hospital within the last three years: Yes Total Score: 75 Level Of Care: New/Established - Level 2 Electronic Signature(s) Signed: 10/27/2015 5:43:56 PM By: Curtis Sites Entered By: Curtis Sites on 10/27/2015 15:24:38 Kristen Wallace ([id number]) -------------------------------------------------------------------------------- Encounter Discharge Information Details Patient Name: Kristen Wallace Date of Service: 10/27/2015 2:45 PM Medical Record Number: [id number] Patient Account Number: [id number] Date of Birth/Sex: 1926/08/26 (81 y.o. Female) Treating RN: Curtis Sites Primary Care Physician: Merri Brunette Other Clinician: Referring Physician: Merri Brunette Treating Physician/Extender: Rudene Re in Treatment: 1 Encounter Discharge Information Items Discharge Pain Level: 0 Discharge Condition: Stable Ambulatory Status:  Walker Discharge Destination: Home Transportation: Private Auto Accompanied By: dtr Schedule Follow-up Appointment: Yes Medication Reconciliation completed and provided to Patient/Care No Rolande Moe: Provided on Clinical Summary of Care: 10/27/2015 Form Type Recipient Paper Patient [facility] Electronic Signature(s) Signed: 10/27/2015 3:43:19 PM By: Gwenlyn Perking Entered By: Gwenlyn Perking on 10/27/2015 15:43:19 Kristen Wallace ([id number]) -------------------------------------------------------------------------------- Multi Wound Chart Details Patient Name: Kristen Wallace Date of Service: 10/27/2015 2:45 PM Medical Record Number: [id number] Patient Account Number: [id number] Date of Birth/Sex: [birth year] (81 y.o. Female) Treating RN: Curtis Sites Primary Care Physician: Merri Brunette Other Clinician: Referring Physician: Merri Brunette Treating Physician/Extender: Rudene Re in Treatment: 1 Vital Signs Height(in): 65 Pulse(bpm): 53 Weight(lbs): 165 Blood Pressure 153/45 (mmHg): Body Mass Index(BMI): 27 Temperature(F): 98.4 Respiratory Rate 18 (breaths/min): Photos: [1:No Photos] [N/A:N/A] Wound Location: [1:Left Abdomen - Lower Quadrant] [N/A:N/A] Wounding Event: [1:Gradually Appeared] [N/A:N/A] Primary Etiology: [1:To be determined] [N/A:N/A] Comorbid History: [1:Hypertension, Type II Diabetes, Osteoarthritis, Neuropathy] [N/A:N/A] Date Acquired: [1:10/09/2015] [N/A:N/A] Weeks of Treatment: [1:1] [N/A:N/A] Wound Status: [1:Open] [N/A:N/A] Measurements L x W x D 1x0.9x0.1 [N/A:N/A] (cm) Area (cm) : [1:0.707] [N/A:N/A] Volume (cm) : [1:0.071] [N/A:N/A] % Reduction in Area: [1:9.90%] [N/A:N/A] % Reduction in Volume: 54.80% [N/A:N/A] Classification: [1:Full Thickness Without Exposed Support Structures] [N/A:N/A] Exudate Amount: [1:Small] [N/A:N/A] Exudate Type: [1:Serous] [N/A:N/A] Exudate Color: [1:amber] [N/A:N/A] Wound Margin: [1:Flat and Intact]  [N/A:N/A] Granulation Amount: [1:Medium (34-66%)] [N/A:N/A] Granulation Quality: [1:Red] [N/A:N/A] Necrotic Amount: [1:Medium (34-66%)] [N/A:N/A] Necrotic Tissue: [1:Eschar, Adherent Slough] [N/A:N/A] Exposed Structures: [1:Fascia: No Fat: No] [N/A:N/A] Tendon: No Muscle: No Joint: No Bone: No Limited to Skin Breakdown Epithelialization: None N/A N/A Periwound Skin Texture: Edema: No N/A N/A Excoriation: No Induration: No Callus: No Crepitus: No Fluctuance: No Friable: No Rash: No Scarring: No Periwound Skin Maceration: No N/A N/A Moisture: Moist: No Dry/Scaly: No Periwound Skin Color: Atrophie Blanche: No N/A N/A Cyanosis: No Ecchymosis: No Erythema: No Hemosiderin  Staining: No Mottled: No Pallor: No Rubor: No Tenderness on No N/A N/A Palpation: Wound Preparation: Ulcer Cleansing: N/A N/A Rinsed/Irrigated with Saline Topical Anesthetic Applied: Other: lidocaine 4% Treatment Notes Electronic Signature(s) Signed: 10/27/2015 5:43:56 PM By: Curtis Sites Entered By: Curtis Sites on 10/27/2015 15:06:35 Kristen Wallace ([id number]) -------------------------------------------------------------------------------- Multi-Disciplinary Care Plan Details Patient Name: Kristen Wallace Date of Service: 10/27/2015 2:45 PM Medical Record Number: [id number] Patient Account Number: [id number] Date of Birth/Sex: Sep 11, 1926 (81 y.o. Female) Treating RN: Curtis Sites Primary Care Physician: Merri Brunette Other Clinician: Referring Physician: Merri Brunette Treating Physician/Extender: Rudene Re in Treatment: 1 Active Inactive Abuse / Safety / Falls / Self Care Management Nursing Diagnoses: Impaired physical mobility Potential for falls Goals: Patient will remain injury free Date Initiated: 10/16/2015 Goal Status: Active Interventions: Assess fall risk on admission and as needed Notes: Orientation to the Wound Care Program Nursing Diagnoses: Knowledge deficit  related to the wound healing center program Goals: Patient/caregiver will verbalize understanding of the Wound Healing Center Program Date Initiated: 10/16/2015 Goal Status: Active Interventions: Provide education on orientation to the wound center Notes: Wound/Skin Impairment Nursing Diagnoses: Impaired tissue integrity Goals: Patient/caregiver will verbalize understanding of skin care regimen MICHELLA, DETJEN ([id number]) Date Initiated: 10/16/2015 Goal Status: Active Ulcer/skin breakdown will have a volume reduction of 30% by week 4 Date Initiated: 10/16/2015 Goal Status: Active Ulcer/skin breakdown will have a volume reduction of 50% by week 8 Date Initiated: 10/16/2015 Goal Status: Active Ulcer/skin breakdown will have a volume reduction of 80% by week 12 Date Initiated: 10/16/2015 Goal Status: Active Ulcer/skin breakdown will heal within 14 weeks Date Initiated: 10/16/2015 Goal Status: Active Interventions: Assess ulceration(s) every visit Notes: Electronic Signature(s) Signed: 10/27/2015 5:43:56 PM By: Curtis Sites Entered By: Curtis Sites on 10/27/2015 15:06:29 Kristen Wallace ([id number]) -------------------------------------------------------------------------------- Patient/Caregiver Education Details Patient Name: Kristen Wallace Date of Service: 10/27/2015 2:45 PM Medical Record Number: [id number] Patient Account Number: [id number] Date of Birth/Gender: Sep 22, 1926 (81 y.o. Female) Treating RN: Curtis Sites Primary Care Physician: Merri Brunette Other Clinician: Referring Physician: Merri Brunette Treating Physician/Extender: Rudene Re in Treatment: 1 Education Assessment Education Provided To: Patient and Caregiver Education Topics Provided Wound/Skin Impairment: Handouts: Other: wound care with ostomy Methods: Demonstration, Explain/Verbal Responses: State content correctly Electronic Signature(s) Signed: 10/27/2015 5:43:56 PM By: Curtis Sites Entered By: Curtis Sites on 10/27/2015 15:39:51 Kristen Wallace ([id number]) -------------------------------------------------------------------------------- Wound Assessment Details Patient Name: Kristen Wallace Date of Service: 10/27/2015 2:45 PM Medical Record Number: [id number] Patient Account Number: [id number] Date of Birth/Sex: Feb 20, 1926 (81 y.o. Female) Treating RN: Curtis Sites Primary Care Physician: Merri Brunette Other Clinician: Referring Physician: Merri Brunette Treating Physician/Extender: Rudene Re in Treatment: 1 Wound Status Wound Number: 1 Primary To be determined Etiology: Wound Location: Left Abdomen - Lower Quadrant Wound Open Status: Wounding Event: Gradually Appeared Comorbid Hypertension, Type II Diabetes, Date Acquired: 10/09/2015 History: Osteoarthritis, Neuropathy Weeks Of Treatment: 1 Clustered Wound: No Photos Photo Uploaded By: Curtis Sites on 10/27/2015 17:42:43 Wound Measurements Length: (cm) 1 Width: (cm) 0.9 Depth: (cm) 0.1 Area: (cm) 0.707 Volume: (cm) 0.071 % Reduction in Area: 9.9% % Reduction in Volume: 54.8% Epithelialization: None Tunneling: No Undermining: No Wound Description Full Thickness Without Exposed Classification: Support Structures Wound Margin: Flat and Intact Exudate Small Amount: Exudate Type: Serous Exudate Color: amber Foul Odor After Cleansing: No Wound Bed Granulation Amount: Medium (34-66%) Exposed Structure Granulation Quality: Red Fascia Exposed: No O[place]rne, Celina ([id number]) Necrotic Amount: Medium (34-66%) Fat Layer Exposed: No Necrotic Quality: Eschar, Adherent Slough Tendon Exposed:  No Muscle Exposed: No Joint Exposed: No Bone Exposed: No Limited to Skin Breakdown Periwound Skin Texture Texture Color No Abnormalities Noted: No No Abnormalities Noted: No Callus: No Atrophie Blanche: No Crepitus: No Cyanosis: No Excoriation: No Ecchymosis: No Fluctuance:  No Erythema: No Friable: No Hemosiderin Staining: No Induration: No Mottled: No Localized Edema: No Pallor: No Rash: No Rubor: No Scarring: No Moisture No Abnormalities Noted: No Dry / Scaly: No Maceration: No Moist: No Wound Preparation Ulcer Cleansing: Rinsed/Irrigated with Saline Topical Anesthetic Applied: Other: lidocaine 4%, Treatment Notes Wound #1 (Left Abdomen - Lower Quadrant) 1. Cleansed with: Clean wound with Normal Saline 2. Anesthetic Topical Lidocaine 4% cream to wound bed prior to debridement 4. Dressing Applied: Medihoney Gel 5. Secondary Dressing Applied Dry Gauze 7. Secured with Secretary/administratorTape Electronic Signature(s) Signed: 10/27/2015 5:43:56 PM By: Curtis Sitesorthy, Joanna Entered By: Curtis Sitesorthy, Joanna on 10/27/2015 15:06:22 Kristen BakeHUDAK, Autumnrose ([id number] Wallace) Fahl, Andersen ([id number]) -------------------------------------------------------------------------------- Vitals Details Patient Name: Kristen BakeHUDAK, Zurisadai Date of Service: 10/27/2015 2:45 PM Medical Record Number: [id number] Patient Account Number: [id number] Date of Birth/Sex: 1926/04/20 (81 y.o. Female) Treating RN: Curtis Sitesorthy, Joanna Primary Care Physician: Merri BrunettePHARR, WALTER Other Clinician: Referring Physician: Merri BrunettePHARR, WALTER Treating Physician/Extender: Rudene ReBritto, Errol Weeks in Treatment: 1 Vital Signs Time Taken: 14:55 Temperature (F): 98.4 Height (in): 65 Pulse (bpm): 53 Weight (lbs): 165 Respiratory Rate (breaths/min): 18 Body Mass Index (BMI): 27.5 Blood Pressure (mmHg): 153/45 Reference Range: 80 - 120 mg / dl Electronic Signature(s) Signed: 10/27/2015 5:43:56 PM By: Curtis Sitesorthy, Joanna Entered By: Curtis Sitesorthy, Joanna on 10/27/2015 15:01:10

## 2015-11-03 ENCOUNTER — Encounter: Payer: Medicare Other | Attending: Surgery | Admitting: Surgery

## 2015-11-03 DIAGNOSIS — K9400 Colostomy complication, unspecified: Secondary | ICD-10-CM | POA: Diagnosis not present

## 2015-11-03 DIAGNOSIS — L98499 Non-pressure chronic ulcer of skin of other sites with unspecified severity: Secondary | ICD-10-CM | POA: Insufficient documentation

## 2015-11-03 DIAGNOSIS — E11622 Type 2 diabetes mellitus with other skin ulcer: Secondary | ICD-10-CM | POA: Insufficient documentation

## 2015-11-03 NOTE — Progress Notes (Addendum)
BATINA, DOUGAN ([id number]) Visit Report for 11/03/2015 Arrival Information Details Patient Name: Kristen Wallace, Kristen Wallace Date of Service: 11/03/2015 2:45 PM Medical Record Number: [id number] Patient Account Number: [id number] Date of Birth/Sex: [birth year] (81 y.o. Female) Treating RN: Afful, RN, BSN, [place] Sink Primary Care Physician: Merri Brunette Other Clinician: Referring Physician: Merri Brunette Treating Physician/Extender: Rudene Re in Treatment: 2 Visit Information History Since Last Visit Any new allergies or adverse reactions: No Patient Arrived: Kristen Wallace Had a fall or experienced change in No Arrival Time: 15:05 activities of daily living that may affect Accompanied By: dtr risk of falls: Transfer Assistance: None Signs or symptoms of abuse/neglect since last No Patient Identification Verified: Yes visito Secondary Verification Process Completed: Yes Hospitalized since last visit: No Patient Has Alerts: Yes Has Dressing in Place as Prescribed: Yes Patient Alerts: DMII Pain Present Now: No Electronic Signature(s) Signed: 11/03/2015 3:06:02 PM By: Elpidio Eric BSN, RN Entered By: Elpidio Eric on 11/03/2015 15:06:02 Kristen Wallace ([id number]) -------------------------------------------------------------------------------- Encounter Discharge Information Details Patient Name: Kristen Wallace Date of Service: 11/03/2015 2:45 PM Medical Record Number: [id number] Patient Account Number: [id number] Date of Birth/Sex: 1926-05-01 (81 y.o. Female) Treating RN: Clover Mealy, RN, BSN, [place] [facility] Primary Care Physician: Merri Brunette Other Clinician: Referring Physician: Merri Brunette Treating Physician/Extender: Rudene Re in Treatment: 2 Encounter Discharge Information Items Discharge Pain Level: 0 Discharge Condition: Stable Ambulatory Status: Walker Discharge Destination: Home Private Transportation: Auto Accompanied By: dtr Schedule Follow-up Appointment: No Medication Reconciliation  completed and No provided to Patient/Care Judit Awad: Clinical Summary of Care: Electronic Signature(s) Signed: 11/03/2015 3:31:32 PM By: Elpidio Eric BSN, RN Entered By: Elpidio Eric on 11/03/2015 15:31:31 Kristen Wallace ([id number]) -------------------------------------------------------------------------------- Lower Extremity Assessment Details Patient Name: Kristen Wallace Date of Service: 11/03/2015 2:45 PM Medical Record Number: [id number] Patient Account Number: [id number] Date of Birth/Sex: Jul 28, 1926 (81 y.o. Female) Treating RN: Afful, RN, BSN, [place] [employer]ink Primary Care Physician: Merri Brunette Other Clinician: Referring Physician: Merri Brunette Treating Physician/Extender: Rudene Re in Treatment: 2 Electronic Signature(s) Signed: 11/03/2015 3:16:34 PM By: Elpidio Eric BSN, RN Entered By: Elpidio Eric on 11/03/2015 15:16:34 Kristen Wallace ([id number]) -------------------------------------------------------------------------------- Multi Wound Chart Details Patient Name: Kristen Wallace Date of Service: 11/03/2015 2:45 PM Medical Record Number: [id number] Patient Account Number: [id number] Date of Birth/Sex: 1926-01-17 (81 y.o. Female) Treating RN: Afful, RN, BSN, Rita Primary Care Physician: Merri Brunette Other Clinician: Referring Physician: Merri Brunette Treating Physician/Extender: Rudene Re in Treatment: 2 Photos: [1:No Photos] [N/A:N/A] Wound Location: [1:Left Abdomen - Lower Quadrant] [N/A:N/A] Wounding Event: [1:Gradually Appeared] [N/A:N/A] Primary Etiology: [1:To be determined] [N/A:N/A] Comorbid History: [1:Hypertension, Type II Diabetes, Osteoarthritis, Neuropathy] [N/A:N/A] Date Acquired: [1:10/09/2015] [N/A:N/A] Weeks of Treatment: [1:2] [N/A:N/A] Wound Status: [1:Open] [N/A:N/A] Measurements L x W x D 1x0.5x0.1 [N/A:N/A] (cm) Area (cm) : [1:0.393] [N/A:N/A] Volume (cm) : [1:0.039] [N/A:N/A] % Reduction in Area: [1:49.90%] [N/A:N/A] % Reduction in  Volume: 75.20% [N/A:N/A] Classification: [1:Full Thickness Without Exposed Support Structures] [N/A:N/A] Exudate Amount: [1:Small] [N/A:N/A] Exudate Type: [1:Serous] [N/A:N/A] Exudate Color: [1:amber] [N/A:N/A] Wound Margin: [1:Flat and Intact] [N/A:N/A] Granulation Amount: [1:Medium (34-66%)] [N/A:N/A] Granulation Quality: [1:Red] [N/A:N/A] Necrotic Amount: [1:Medium (34-66%)] [N/A:N/A] Exposed Structures: [1:Fascia: No Fat: No Tendon: No Muscle: No Joint: No Bone: No Limited to Skin Breakdown] [N/A:N/A] Epithelialization: [1:Small (1-33%)] [N/A:N/A] Periwound Skin Texture: Edema: No [1:Excoriation: No] [N/A:N/A] Induration: No Callus: No Crepitus: No Fluctuance: No Friable: No Rash: No Scarring: No Periwound Skin Moist: Yes N/A N/A Moisture: Maceration: No Dry/Scaly: No Periwound Skin Color: Atrophie Blanche: No N/A N/A Cyanosis: No Ecchymosis: No Erythema: No  Hemosiderin Staining: No Mottled: No Pallor: No Rubor: No Temperature: No Abnormality N/A N/A Tenderness on No N/A N/A Palpation: Wound Preparation: Ulcer Cleansing: N/A N/A Rinsed/Irrigated with Saline Topical Anesthetic Applied: Other: lidocaine 4% Treatment Notes Electronic Signature(s) Signed: 11/03/2015 3:17:52 PM By: Elpidio Eric BSN, RN Entered By: Elpidio Eric on 11/03/2015 15:17:52 Kristen Wallace ([id number]) -------------------------------------------------------------------------------- Multi-Disciplinary Care Plan Details Patient Name: Kristen Wallace Date of Service: 11/03/2015 2:45 PM Medical Record Number: [id number] Patient Account Number: [id number] Date of Birth/Sex: 1926/04/17 (81 y.o. Female) Treating RN: Afful, RN, BSN, [place] Sink Primary Care Physician: Merri Brunette Other Clinician: Referring Physician: Merri Brunette Treating Physician/Extender: Rudene Re in Treatment: 2 Active Inactive Abuse / Safety / Falls / Self Care Management Nursing Diagnoses: Impaired physical  mobility Potential for falls Goals: Patient will remain injury free Date Initiated: 10/16/2015 Goal Status: Active Interventions: Assess fall risk on admission and as needed Notes: Orientation to the Wound Care Program Nursing Diagnoses: Knowledge deficit related to the wound healing center program Goals: Patient/caregiver will verbalize understanding of the Wound Healing Center Program Date Initiated: 10/16/2015 Goal Status: Active Interventions: Provide education on orientation to the wound center Notes: Wound/Skin Impairment Nursing Diagnoses: Impaired tissue integrity Goals: Patient/caregiver will verbalize understanding of skin care regimen NATORI, GUDINO ([id number]) Date Initiated: 10/16/2015 Goal Status: Active Ulcer/skin breakdown will have a volume reduction of 30% by week 4 Date Initiated: 10/16/2015 Goal Status: Active Ulcer/skin breakdown will have a volume reduction of 50% by week 8 Date Initiated: 10/16/2015 Goal Status: Active Ulcer/skin breakdown will have a volume reduction of 80% by week 12 Date Initiated: 10/16/2015 Goal Status: Active Ulcer/skin breakdown will heal within 14 weeks Date Initiated: 10/16/2015 Goal Status: Active Interventions: Assess ulceration(s) every visit Notes: Electronic Signature(s) Signed: 11/03/2015 3:17:42 PM By: Elpidio Eric BSN, RN Entered By: Elpidio Eric on 11/03/2015 15:17:42 Kristen Wallace ([id number]) -------------------------------------------------------------------------------- Pain Assessment Details Patient Name: Kristen Wallace Date of Service: 11/03/2015 2:45 PM Medical Record Number: [id number] Patient Account Number: [id number] Date of Birth/Sex: August 13, 1926 (81 y.o. Female) Treating RN: Clover Mealy, RN, BSN, [place] Sink Primary Care Physician: Merri Brunette Other Clinician: Referring Physician: Merri Brunette Treating Physician/Extender: Rudene Re in Treatment: 2 Active Problems Location of Pain Severity and  Description of Pain Patient Has Paino No Site Locations Pain Management and Medication Current Pain Management: Electronic Signature(s) Signed: 11/03/2015 3:07:29 PM By: Elpidio Eric BSN, RN Entered By: Elpidio Eric on 11/03/2015 15:07:28 Kristen Wallace ([id number]) -------------------------------------------------------------------------------- Patient/Caregiver Education Details Patient Name: Kristen Wallace Date of Service: 11/03/2015 2:45 PM Medical Record Number: [id number] Patient Account Number: [id number] Date of Birth/Gender: [birth year] (81 y.o. Female) Treating RN: Clover Mealy, RN, BSN, [place] Sink Primary Care Physician: Merri Brunette Other Clinician: Referring Physician: Merri Brunette Treating Physician/Extender: Rudene Re in Treatment: 2 Education Assessment Education Provided To: Patient Education Topics Provided Welcome To The Wound Care Center: Methods: Explain/Verbal Responses: State content correctly Electronic Signature(s) Signed: 11/03/2015 3:31:42 PM By: Elpidio Eric BSN, RN Entered By: Elpidio Eric on 11/03/2015 15:31:42 Kristen Wallace ([id number]) -------------------------------------------------------------------------------- Wound Assessment Details Patient Name: Kristen Wallace Date of Service: 11/03/2015 2:45 PM Medical Record Number: [id number] Patient Account Number: [id number] Date of Birth/Sex: 12/16/1925 (81 y.o. Female) Treating RN: Clover Mealy, RN, BSN, [place]ink Primary Care Physician: Merri Brunette Other Clinician: Referring Physician: Merri Brunette Treating Physician/Extender: Rudene Re in Treatment: 2 Wound Status Wound Number: 1 Primary To be determined Etiology: Wound Location: Left Abdomen - Lower Quadrant Wound Open Status: Wounding Event: Gradually Appeared Comorbid Hypertension, Type II Diabetes, Date Acquired: 10/09/2015 History:  Osteoarthritis, Neuropathy Weeks Of Treatment: 2 Clustered Wound: No Photos Photo Uploaded By: Elpidio EricAfful, Rita on  11/03/2015 17:02:00 Wound Measurements Length: (cm) 1 Width: (cm) 0.5 Depth: (cm) 0.1 Area: (cm) 0.393 Volume: (cm) 0.039 % Reduction in Area: 49.9% % Reduction in Volume: 75.2% Epithelialization: Small (1-33%) Tunneling: No Undermining: No Wound Description Full Thickness Without Exposed Foul Odor Af Classification: Support Structures Wound Margin: Flat and Intact Exudate Small Amount: Exudate Type: Serous Exudate Color: amber ter Cleansing: No Wound Bed Granulation Amount: Medium (34-66%) Exposed Structure Granulation Quality: Red Fascia Exposed: No Shanafelt, Aija ([id number]) Necrotic Amount: Medium (34-66%) Fat Layer Exposed: No Necrotic Quality: Adherent Slough Tendon Exposed: No Muscle Exposed: No Joint Exposed: No Bone Exposed: No Limited to Skin Breakdown Periwound Skin Texture Texture Color No Abnormalities Noted: No No Abnormalities Noted: No Callus: No Atrophie Blanche: No Crepitus: No Cyanosis: No Excoriation: No Ecchymosis: No Fluctuance: No Erythema: No Friable: No Hemosiderin Staining: No Induration: No Mottled: No Localized Edema: No Pallor: No Rash: No Rubor: No Scarring: No Temperature / Pain Moisture Temperature: No Abnormality No Abnormalities Noted: No Dry / Scaly: No Maceration: No Moist: Yes Wound Preparation Ulcer Cleansing: Rinsed/Irrigated with Saline Topical Anesthetic Applied: Other: lidocaine 4%, Treatment Notes Wound #1 (Left Abdomen - Lower Quadrant) 1. Cleansed with: Clean wound with Normal Saline 4. Dressing Applied: Medihoney Gel 5. Secondary Dressing Applied Dry Gauze 7. Secured with Other (specify in notes) Notes Covered with the wafer from the colostomy. Electronic Signature(s) Signed: 11/03/2015 5:35:27 PM By: Elpidio EricAfful, Rita BSN, RN Entered By: Elpidio EricAfful, Rita on 11/03/2015 15:11:37 Kristen BakeHUDAK, Jabria ([id number]) Kristen BakeHUDAK, Chapel  ([id number]) -------------------------------------------------------------------------------- Vitals Details Patient Name: Kristen BakeHUDAK, Udell Date of Service: 11/03/2015 2:45 PM Medical Record Number: [id number] Patient Account Number: [id number] Date of Birth/Sex: 03-25-1926 (81 y.o. Female) Treating RN: Clover MealyAfful, RN, BSN, [place] Primary Care Physician: Merri BrunettePHARR, WALTER Other Clinician: Referring Physician: Merri BrunettePHARR, WALTER Treating Physician/Extender: Rudene ReBritto, Errol Weeks in Treatment: 2 Vital Signs Time Taken: 15:07 Reference Range: 80 - 120 mg / dl Height (in): 65 Weight (lbs): 165 Body Mass Index (BMI): 27.5 Electronic Signature(s) Signed: 11/03/2015 3:09:53 PM By: Elpidio EricAfful, Rita BSN, RN Entered By: Elpidio EricAfful, Rita on 11/03/2015 15:09:52

## 2015-11-04 NOTE — Progress Notes (Signed)
Kristen Wallace, Kristen Wallace ([id number]) Visit Report for 11/03/2015 Chief Complaint Document Details Patient Name: Kristen Wallace, Kristen Wallace Date of Service: 11/03/2015 2:45 PM Medical Record Patient Account Number: [id number] Number: Afful, RN, BSN, Treating RN: [birth year] (81 y.o. [place] Date of Birth/Sex: Female) Other Clinician: Primary Care Physician: Merri Brunette Treating Evlyn Kanner Referring Physician: Merri Brunette Physician/Extender: Tania Ade in Treatment: 2 Information Obtained from: Patient Chief Complaint Patient presents to the wound care center for a consult due non healing wound she had a nodule above the left lower quadrant colostomy for about 6 months which has now opened into an ulcer for the last week. Electronic Signature(s) Signed: 11/03/2015 3:39:50 PM By: Evlyn Kanner MD, FACS Entered By: Evlyn Kanner on 11/03/2015 15:39:50 Kristen Wallace ([id number]) -------------------------------------------------------------------------------- Debridement Details Patient Name: Kristen Wallace Date of Service: 11/03/2015 2:45 PM Medical Record Patient Account Number: [id number] Number: Afful, RN, BSN, Treating RN: September 06, 1926 (81 y.o. [place] Sink Date of Birth/Sex: Female) Other Clinician: Primary Care Physician: Merri Brunette Treating Tyler Cubit Referring Physician: Merri Brunette Physician/Extender: Tania Ade in Treatment: 2 Debridement Performed for Wound #1 Left Abdomen - Lower Quadrant Assessment: Performed By: Physician Evlyn Kanner, MD Debridement: Debridement Pre-procedure Yes Verification/Time Out Taken: Start Time: 15:16 Pain Control: Lidocaine 4% Topical Solution Level: Skin/Subcutaneous Tissue Total Area Debrided (L x 1 (cm) x 0.5 (cm) = 0.5 (cm) W): Tissue and other Non-Viable, Fibrin/Slough, Subcutaneous material debrided: Instrument: Curette Bleeding: Minimum Hemostasis Achieved: Pressure End Time: 15:19 Procedural Pain: 0 Post Procedural Pain: 0 Response to  Treatment: Procedure was tolerated well Post Debridement Measurements of Total Wound Length: (cm) 1 Width: (cm) 0.5 Depth: (cm) 0.1 Volume: (cm) 0.039 Post Procedure Diagnosis Same as Pre-procedure Electronic Signature(s) Signed: 11/03/2015 3:39:44 PM By: Evlyn Kanner MD, FACS Signed: 11/03/2015 5:35:27 PM By: Elpidio Eric BSN, RN Previous Signature: 11/03/2015 3:18:57 PM Version By: Elpidio Eric BSN, RN Entered By: Evlyn Kanner on 11/03/2015 15:39:43 Kristen Wallace, Kristen Wallace ([id number]) Kristen Wallace, Kristen Wallace ([id number]) -------------------------------------------------------------------------------- HPI Details Patient Name: Kristen Wallace Date of Service: 11/03/2015 2:45 PM Medical Record Patient Account Number: [id number] Number: Afful, RN, BSN, Treating RN: Aug 22, 1926 (81 y.o. [place] Sink Date of Birth/Sex: Female) Other Clinician: Primary Care Physician: Merri Brunette Treating Evlyn Kanner Referring Physician: Merri Brunette Physician/Extender: Weeks in Treatment: 2 History of Present Illness Location: ulcerative area just above the colostomy opening in the left lower quadrant Quality: Patient reports No Pain. Severity: Patient states wound (s) are getting better. Duration: Patient has had the wound for > 6 months prior to seeking treatment at the wound center Context: The wound appeared gradually over time. it was a nodule initially and gradually broke down into an open ulcer. Modifying Factors: Other treatment(s) tried include: her PCP has put her on Augmentin recently Associated Signs and Symptoms: Wound has no purlulent drainage and her colostomy is functioning fine HPI Description: Very pleasant 81 year old patient whose had a nodule just above the stoma and the left lower quadrant where she's had a colostomy for about 17-1/2 years. About a week ago the nodule opened into an ulcerated area and she was recently put on Augmentin for this. Colostomy is functioning fine and she has no issues  with applying the bag. Her past medical history significant for diabetes mellitus type 2 SLE, OA, DDD, osteopenia, narcotic contract. Of note she is on chronic doses of prednisone which is 10 mg daily. She has never been a smoker. 10/27/2015 -- She had a culture from a wound which grew staph aureus which is MSSA. Her other labs sent and  showed a WC count of 5.2 with hemoglobin of 12.2 hematocrit of 37.3 and platelets of 154. The patient also had her BMP done which showed slight elevation of her BUN/creatinine and her glucose was 167. Hemoglobin A1c was 5.6 Of note her supplies did not arrive and she has not been using Medihoney on her wound. Unfortunately they did not call us to let is know the lack of supplies. We will work with the vendor today to make certain that these reach her ASAP. Electronic Signature(s) Signed: 11/03/2015 3:39:58 PM By: Evlyn Kanner MD, FACS Entered By: Evlyn Kanner on 11/03/2015 15:39:58 Kristen Wallace, Kristen Wallace ([id number]) -------------------------------------------------------------------------------- Physical Exam Details Patient Name: Kristen Wallace Date of Service: 11/03/2015 2:45 PM Medical Record Patient Account Number: [id number] Number: Afful, RN, BSN, Treating RN: [birth year] (81 y.o. [place] Sink Date of Birth/Sex: Female) Other Clinician: Primary Care Physician: Merri Brunette Treating Evlyn Kanner Referring Physician: Merri Brunette Physician/Extender: Weeks in Treatment: 2 Constitutional . Pulse regular. Respirations normal and unlabored. Afebrile. . Eyes Nonicteric. Reactive to light. Ears, Nose, Mouth, and Throat Lips, teeth, and gums WNL.Marland Kitchen Moist mucosa without lesions . Neck supple and nontender. No palpable supraclavicular or cervical adenopathy. Normal sized without goiter. Respiratory WNL. No retractions.. Cardiovascular Pedal Pulses WNL. No clubbing, cyanosis or edema. Chest Breasts symmetical and no nipple discharge.. Breast tissue WNL, no  masses, lumps, or tenderness.. Lymphatic No adneopathy. No adenopathy. No adenopathy. Musculoskeletal Adexa without tenderness or enlargement.. Digits and nails w/o clubbing, cyanosis, infection, petechiae, ischemia, or inflammatory conditions.. Integumentary (Hair, Skin) No suspicious lesions. No crepitus or fluctuance. No peri-wound warmth or erythema. No masses.Marland Kitchen Psychiatric Judgement and insight Intact.. No evidence of depression, anxiety, or agitation.. Notes The stoma again continues to look normal and the ulceration at the 12:00 position has sharp debridement been done to remove some of the slough and there is healthy granulation tissue under this. Electronic Signature(s) Signed: 11/03/2015 3:40:35 PM By: Evlyn Kanner MD, FACS Entered By: Evlyn Kanner on 11/03/2015 15:40:35 Kristen Wallace ([id number]) -------------------------------------------------------------------------------- Physician Orders Details Patient Name: Kristen Wallace Date of Service: 11/03/2015 2:45 PM Medical Record Patient Account Number: [id number] Number: Afful, RN, BSN, Treating RN: 10-04-1926 (81 y.o. [place] Sink Date of Birth/Sex: Female) Other Clinician: Primary Care Physician: Merri Brunette Treating Evlyn Kanner Referring Physician: Merri Brunette Physician/Extender: Tania Ade in Treatment: 2 Verbal / Phone Orders: Yes Clinician: Afful, RN, BSN, Rita Read Back and Verified: Yes Diagnosis Coding Wound Cleansing Wound #1 Left Abdomen - Lower Quadrant o Clean wound with Normal Saline. Anesthetic Wound #1 Left Abdomen - Lower Quadrant o Topical Lidocaine 4% cream applied to wound bed prior to debridement Primary Wound Dressing Wound #1 Left Abdomen - Lower Quadrant o Medihoney gel Secondary Dressing Wound #1 Left Abdomen - Lower Quadrant o Dry Gauze Dressing Change Frequency Wound #1 Left Abdomen - Lower Quadrant o Change dressing every day. Follow-up Appointments Wound #1 Left  Abdomen - Lower Quadrant o Return Appointment in 1 week. Home Health Wound #1 Left Abdomen - Lower Quadrant o Continue Home Health Visits - [place] o Home Health Nurse may visit PRN to address patientos wound care needs. o FACE TO FACE ENCOUNTER: MEDICARE and MEDICAID PATIENTS: I certify that this patient is under my care and that I had a face-to-face encounter that meets the physician face-to-face encounter requirements with this patient on this date. The encounter with the patient was in whole or in part for the following MEDICAL CONDITION: (primary reason for Home Healthcare) MEDICAL NECESSITY: I certify, that based on  my findings, NURSING services are a medically Kristen Wallace, Kristen Wallace ([id number]) necessary home health service. HOME BOUND STATUS: I certify that my clinical findings support that this patient is homebound (i.e., Due to illness or injury, pt requires aid of supportive devices such as crutches, cane, wheelchairs, walkers, the use of special transportation or the assistance of another person to leave their place of residence. There is a normal inability to leave the home and doing so requires considerable and taxing effort. Other absences are for medical reasons / religious services and are infrequent or of short duration when for other reasons). o If current dressing causes regression in wound condition, may D/C ordered dressing product/s and apply Normal Saline Moist Dressing daily until next Wound Healing Center / Other MD appointment. Notify Wound Healing Center of regression in wound condition at [phone]. o Please direct any NON-WOUND related issues/requests for orders to patient's Primary Care Physician Electronic Signature(s) Signed: 11/03/2015 3:24:23 PM By: Elpidio Eric BSN, RN Signed: 11/03/2015 4:34:28 PM By: Evlyn Kanner MD, FACS Entered By: Elpidio Eric on 11/03/2015 15:24:23 Kristen Wallace  ([id number]) -------------------------------------------------------------------------------- Problem List Details Patient Name: Kristen Wallace Date of Service: 11/03/2015 2:45 PM Medical Record Patient Account Number: [id number] Number: Afful, RN, BSN, Treating RN: 1926/01/25 (81 y.o. 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 [place] Sink Date of Birth/Sex: Female) Other Clinician: Primary Care Physician: Merri Brunette Treating Evlyn Kanner Referring Physician: Merri Brunette Physician/Extender: Tania Ade in Treatment: 2 Subjective Chief Complaint Information obtained from Patient Patient presents to the wound care center for a consult due non healing wound she had a nodule above the left lower quadrant colostomy for about 6 months which has now opened into an ulcer for the last week. History of Present Illness (HPI) The following HPI elements were documented for the patient's wound: Location: ulcerative area just above the colostomy opening in the left lower  quadrant Quality: Patient reports No Pain. Severity: Patient states wound (s) are getting better. Duration: Patient has had the wound for > 6 months prior to seeking treatment at the wound center Context: The wound appeared gradually over time. it was a nodule initially and gradually broke down into an open ulcer. Modifying Factors: Other treatment(s) tried include: her PCP has put her on Augmentin recently Associated Signs and Symptoms: Wound has no purlulent drainage and her colostomy is functioning fine Very pleasant 81 year old patient whose had a nodule just above the stoma and the left lower quadrant where she's had a colostomy for about 17-1/2 years. About a week ago the nodule opened into an ulcerated area and she was recently put on Augmentin for this. Colostomy is functioning fine and she has no issues with applying the bag. Her past medical history significant for diabetes mellitus type 2 SLE, OA, DDD, osteopenia, narcotic contract. Of note she is on chronic doses of prednisone which  is 10 mg daily. She has never been a smoker. 10/27/2015 -- She had a culture from a wound which grew staph aureus which is MSSA. Her other labs sent and showed a WC count of 5.2 with hemoglobin of 12.2 hematocrit of 37.3 and platelets of 154. The patient also had her BMP done which showed slight elevation of her BUN/creatinine and her glucose was 167. Hemoglobin A1c was 5.6 Of note her supplies did not arrive and she has not been using Medihoney on her wound. Unfortunately they did not call us to let is know the lack of supplies. We will work with the vendor today to make certain that these reach her ASAP. Kristen Wallace, Kristen Wallace ([id number]) Objective Constitutional Pulse regular. Respirations normal and unlabored. Afebrile. Vitals Time Taken: 3:07 PM, Height: 65 in, Weight: 165 lbs, BMI: 27.5. Eyes Nonicteric. Reactive to light. Ears, Nose, Mouth, and Throat Lips, teeth, and gums WNL.Marland Kitchen Moist mucosa  without lesions . Neck supple and nontender. No palpable supraclavicular or cervical adenopathy. Normal sized without goiter. Respiratory WNL. No retractions.. Cardiovascular Pedal Pulses WNL. No clubbing, cyanosis or edema. Chest Breasts symmetical and no nipple discharge.. Breast tissue WNL, no masses, lumps, or tenderness.. Lymphatic No adneopathy. No adenopathy. No adenopathy. Musculoskeletal Adexa without tenderness or enlargement.. Digits and nails w/o clubbing, cyanosis, infection, petechiae, ischemia, or inflammatory conditions.Marland Kitchen Psychiatric Judgement and insight Intact.. No evidence of depression, anxiety, or agitation.. General Notes: The stoma again continues to look normal and the ulceration at the 12:00 position has sharp debridement been done to remove some of the slough and there is healthy granulation tissue under this. Integumentary (Hair, Skin) No suspicious lesions. No crepitus or fluctuance. No peri-wound warmth or erythema. No masses.. Wound #1 status is Open. Original cause of wound was Gradually Appeared. The wound is located on the Left Abdomen - Lower Quadrant. The wound measures 1cm length x 0.5cm width x 0.1cm depth; 0.393cm^2 area and 0.039cm^3 volume. The wound is limited to skin breakdown. There is no tunneling or Kristen Wallace, Kristen Wallace ([id number]) undermining noted. There is a small amount of serous drainage noted. The wound margin is flat and intact. There is medium (34-66%) red granulation within the wound bed. There is a medium (34-66%) amount of necrotic tissue within the wound bed including Adherent Slough. The periwound skin appearance exhibited: Moist. The periwound skin appearance did not exhibit: Callus, Crepitus, Excoriation, Fluctuance, Friable, Induration, Localized Edema, Rash, Scarring, Dry/Scaly, Maceration, Atrophie Blanche, Cyanosis, Ecchymosis, Hemosiderin Staining, Mottled, Pallor, Rubor, Erythema. Periwound temperature was noted as No  Abnormality. Assessment Active Problems ICD-10 E11.622 - Type 2 diabetes mellitus with other skin ulcer L98.499 - Non-pressure chronic ulcer of skin of other sites with unspecified severity K94.00 - Colostomy complication, unspecified I have recommended we continue with the Medihoney and an appropriate dressing over this, to be placed under the wafer of the colostomy bag. It is showing improvement but if it continues to stall we will do a wedge biopsy of the wound edges under local anesthesia. Procedures Wound #1 Wound #1 is a To be determined located on the Left Abdomen - Lower Quadrant . There was a Skin/Subcutaneous Tissue Debridement ([id number]) debridement with total area of 0.5 sq cm performed by Evlyn Kanner, MD. with the following instrument(s): Curette to remove Non-Viable tissue/material including Fibrin/Slough and Subcutaneous after achieving pain control using Lidocaine 4% Topical Solution. A time out was conducted prior to the start of the procedure. A Minimum amount of bleeding was controlled with Pressure. The procedure was tolerated  well with a pain level of 0 throughout and a pain level of 0 following the procedure. Post Debridement Measurements: 1cm length x 0.5cm width x 0.1cm depth; 0.039cm^3 volume. Post procedure Diagnosis Wound #1: Same as Pre-Procedure Plan Kristen Wallace, Kristen Wallace ([id number]) Wound Cleansing: Wound #1 Left Abdomen - Lower Quadrant: Clean wound with Normal Saline. Anesthetic: Wound #1 Left Abdomen - Lower Quadrant: Topical Lidocaine 4% cream applied to wound bed prior to debridement Primary Wound Dressing: Wound #1 Left Abdomen - Lower Quadrant: Medihoney gel Secondary Dressing: Wound #1 Left Abdomen - Lower Quadrant: Dry Gauze Dressing Change Frequency: Wound #1 Left Abdomen - Lower Quadrant: Change dressing every day. Follow-up Appointments: Wound #1 Left Abdomen - Lower Quadrant: Return Appointment in 1 week. Home Health: Wound #1 Left  Abdomen - Lower Quadrant: Continue Home Health Visits - [facility] Nurse may visit PRN to address patient s wound care needs. FACE TO FACE ENCOUNTER: MEDICARE and MEDICAID PATIENTS: I certify that this patient is under my care and that I had a face-to-face encounter that meets the physician face-to-face encounter requirements with this patient on this date. The encounter with the patient was in whole or in part for the following MEDICAL CONDITION: (primary reason for Home Healthcare) MEDICAL NECESSITY: I certify, that based on my findings, NURSING services are a medically necessary home health service. HOME BOUND STATUS: I certify that my clinical findings support that this patient is homebound (i.e., Due to illness or injury, pt requires aid of supportive devices such as crutches, cane, wheelchairs, walkers, the use of special transportation or the assistance of another person to leave their place of residence. There is a normal inability to leave the home and doing so requires considerable and taxing effort. Other absences are for medical reasons / religious services and are infrequent or of short duration when for other reasons). If current dressing causes regression in wound condition, may D/C ordered dressing product/s and apply Normal Saline Moist Dressing daily until next Wound Healing Center / Other MD appointment. Notify Wound Healing Center of regression in wound condition at [phone]. Please direct any NON-WOUND related issues/requests for orders to patient's Primary Care Physician I have recommended we continue with the Medihoney and an appropriate dressing over this, to be placed under the wafer of the colostomy bag. It is showing improvement but if it continues to stall we will do a wedge biopsy of the wound edges under local anesthesia. Electronic Signature(s) Kristen Wallace, Kristen Wallace ([id number]) Signed: 11/03/2015 3:41:48 PM By: Evlyn Kanner MD, FACS Entered By: Evlyn Kanner  on 11/03/2015 15:41:48 Kristen Wallace ([id number]) -------------------------------------------------------------------------------- SuperBill Details Patient Name: Kristen Wallace Date of Service: 11/03/2015 Medical Record Patient Account Number: [id number] Number: Afful, RN, BSN, Treating RN: July 25, 1926 (81 y.o. 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 K94.00 Colostomy complication, unspecified Facility Procedures CPT4 Code Description: 29528413 11042 - DEB SUBQ TISSUE 20 SQ CM/< ICD-10 Description Diagnosis E11.622 Type 2 diabetes mellitus with other skin ulcer 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 wi K94.00 Colostomy complication, unspecified Modifier: th unspecified Quantity: 1 severity Physician Procedures CPT4 Code Description: 2440102 11042 - WC PHYS SUBQ TISS 20 SQ CM ICD-10 Description Diagnosis E11.622 Type 2 diabetes mellitus with other skin ulcer L98.499 Non-pressure  chronic ulcer of skin of other sites wi K94.00 Colostomy complication, unspecified Modifier: th unspecified Quantity: 1 severity Electronic Signature(s) Signed: 11/03/2015 3:42:28 PM By: Evlyn KannerBritto, Nohealani Medinger MD, FACS Previous Signature: 11/03/2015 3:42:00 PM Version By: Evlyn KannerBritto, Tsuruko Murtha MD, FACS Entered By: Evlyn KannerBritto, Yochanan Eddleman on 11/03/2015 15:42:28

## 2015-11-10 ENCOUNTER — Encounter: Payer: Medicare Other | Admitting: Surgery

## 2015-11-10 DIAGNOSIS — E11622 Type 2 diabetes mellitus with other skin ulcer: Secondary | ICD-10-CM | POA: Diagnosis not present

## 2015-11-11 NOTE — Progress Notes (Signed)
Kristen Wallace ([id number]) Visit Report for 11/10/2015 Chief Complaint Document Details Patient Name: Kristen Wallace, Kristen Wallace 11/10/2015 2:00 Date of Service: PM Medical Record [id number] Number: Patient Account Number: [id number] [birth year] (81 y.o. Treating RN: Kristen Wallace Date of Birth/Sex: Female) Other Clinician: Primary Care Physician: Kristen Wallace Referring Physician: Merri Wallace Physician/Extender: Kristen Wallace in Treatment: 3 Information Obtained from: Patient Chief Complaint Patient presents to the wound care center for a consult due non healing wound she had a nodule above the left lower quadrant colostomy for about 6 months which has now opened into an ulcer for the last week. Electronic Signature(s) Signed: 11/10/2015 2:28:48 PM By: Kristen Kanner MD, FACS Entered By: Kristen Wallace on 11/10/2015 14:28:48 Kristen Wallace, Kristen Wallace ([id number]) -------------------------------------------------------------------------------- HPI Details Patient Name: Kristen Wallace, Kristen Wallace 11/10/2015 2:00 Date of Service: PM Medical Record [id number] Number: Patient Account Number: [id number] 08-Aug-1976 (81 y.o. Treating RN: Kristen Wallace Date of Birth/Sex: Female) Other Clinician: Primary Care Physician: Kristen Wallace Treating Kristen Wallace Referring Physician: Merri Wallace Physician/Extender: Weeks in Treatment: 3 History of Present Illness Location: ulcerative area just above the colostomy opening in the left lower quadrant Quality: Patient reports No Pain. Severity: Patient states wound (s) are getting better. Duration: Patient has had the wound for > 6 months prior to seeking treatment at the wound center Context: The wound appeared gradually over time. it was a nodule initially and gradually broke down into an open ulcer. Modifying Factors: Other treatment(s) tried include: her PCP has put her on Augmentin recently Associated Signs and Symptoms: Wound has no purlulent drainage and her  colostomy is functioning fine HPI Description: Very pleasant 81 year old patient whose had a nodule just above the stoma and the left lower quadrant where she's had a colostomy for about 17-1/2 years. About a week ago the nodule opened into an ulcerated area and she was recently put on Augmentin for this. Colostomy is functioning fine and she has no issues with applying the bag. Her past medical history significant for diabetes mellitus type 2 SLE, OA, DDD, osteopenia, narcotic contract. Of note she is on chronic doses of prednisone which is 10 mg daily. She has never been a smoker. 10/26/78 -- She had a culture from a wound which grew staph aureus which is MSSA. Her other labs sent and showed a WC count of 5.2 with hemoglobin of 12.2 hematocrit of 37.3 and platelets of 154. The patient also had her BMP done which showed slight elevation of her BUN/creatinine and her glucose was 167. Hemoglobin A1c was 5.6 Of note her supplies did not arrive and she has not been using Medihoney on her wound. Unfortunately they did not call us to let is know the lack of supplies. We will work with the vendor today to make certain that these reach her ASAP. 11/10/2015 -- she has been doing fine and has no new issues at the present time. Overall she has been doing her dressing as directed and finds this good improvement. Electronic Signature(s) Signed: 11/10/2015 2:29:20 PM By: Kristen Kanner MD, FACS Entered By: Kristen Wallace on 11/10/2015 14:29:19 Kristen Wallace, Kristen Wallace ([id number]) -------------------------------------------------------------------------------- Physical Exam Details Patient Name: Kristen Wallace 11/10/2015 2:00 Date of Service: PM Medical Record [id number] Number: Patient Account Number: [id number] 10-Oct-1976 (81 y.o. Treating RN: Kristen Wallace Date of Birth/Sex: Female) Other Clinician: Primary Care Physician: Kristen Wallace Treating Kristen Wallace Referring Physician: Merri Wallace Physician/Extender: Weeks in Treatment: 3 Constitutional . Pulse regular. Respirations normal and unlabored. Afebrile. . Eyes Nonicteric. Reactive to light. Ears, Nose, Mouth,  and Throat Lips, teeth, and gums WNL.Marland Kitchen Moist mucosa without lesions . Neck supple and nontender. No palpable supraclavicular or cervical adenopathy. Normal sized without goiter. Respiratory WNL. No retractions.. Cardiovascular Pedal Pulses WNL. No clubbing, cyanosis or edema. Lymphatic No adneopathy. No adenopathy. No adenopathy. Musculoskeletal Adexa without tenderness or enlargement.. Digits and nails w/o clubbing, cyanosis, infection, petechiae, ischemia, or inflammatory conditions.. Integumentary (Hair, Skin) No suspicious lesions. No crepitus or fluctuance. No peri-wound warmth or erythema. No masses.Marland Kitchen Psychiatric Judgement and insight Intact.. No evidence of depression, anxiety, or agitation.. Notes no debridement was required today as the edges are looking smooth and there is healthy granulation tissue. Electronic Signature(s) Signed: 11/10/2015 2:29:59 PM By: Kristen Kanner MD, FACS Entered By: Kristen Wallace on 11/10/2015 14:29:58 Kristen Wallace ([id number]) -------------------------------------------------------------------------------- Physician Orders Details Patient Name: Kristen Wallace 11/10/2015 2:00 Date of Service: PM Medical Record [id number] Number: Patient Account Number: [id number] 1976-06-25 (81 y.o. Treating RN: Kristen Wallace Date of Birth/Sex: Female) Other Clinician: Primary Care Physician: Kristen Wallace Referring Physician: Merri Wallace Physician/Extender: Kristen Wallace in Treatment: 3 Verbal / Phone Orders: Yes Clinician: Curtis Wallace Read Back and Verified: Yes Diagnosis Coding Wound Cleansing Wound #1 Left Abdomen - Lower Quadrant o Clean wound with Normal Saline. Anesthetic Wound #1 Left Abdomen - Lower Quadrant o Topical Lidocaine 4%  cream applied to wound bed prior to debridement Primary Wound Dressing Wound #1 Left Abdomen - Lower Quadrant o Prisma Ag Secondary Dressing Wound #1 Left Abdomen - Lower Quadrant o Dry Gauze Dressing Change Frequency Wound #1 Left Abdomen - Lower Quadrant o Change dressing every day. Follow-up Appointments Wound #1 Left Abdomen - Lower Quadrant o Return Appointment in 1 week. Electronic Signature(s) Signed: 11/10/2015 3:06:45 PM By: Kristen Kanner MD, FACS Signed: 11/10/2015 4:09:06 PM By: Kristen Wallace Entered By: Kristen Wallace on 11/10/2015 14:20:53 Kristen Wallace, Kristen Wallace ([id number]) -------------------------------------------------------------------------------- Problem List Details Patient Name: LASHARN, BUFKIN 11/10/2015 2:00 Date of Service: PM Medical Record [id number] Number: Patient Account Number: [id number] October 30, 1926 (81 y.o. 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 Treating RN: Kristen Wallace Date of Birth/Sex: Female) Other Clinician: Primary Care  Physician: Kristen Wallace Referring Physician: Merri Wallace Physician/Extender: Kristen Wallace in Treatment: 3 Subjective Chief Complaint Information obtained from Patient Patient presents to the wound care center for a consult due non healing wound she had a nodule above the left lower quadrant colostomy for about 6 months which has now opened into an ulcer for the last week. History of Present Illness (HPI) The following HPI elements were documented for the patient's wound: Location: ulcerative area just above the colostomy opening in the left lower quadrant Quality: Patient reports No Pain. Severity: Patient states wound (s) are getting better. Duration: Patient has had the wound for > 6 months prior to seeking treatment at the wound center Context: The wound appeared gradually over time.  it was a nodule initially and gradually broke down into an open ulcer. Modifying Factors: Other treatment(s) tried include: her PCP has put her on Augmentin recently Associated Signs and Symptoms: Wound has no purlulent drainage and her colostomy is functioning fine Very pleasant 81 year old patient whose had a nodule just above the stoma and the left lower quadrant where she's had a colostomy for about 17-1/2 years. About a week ago the nodule opened into an ulcerated area and she was recently put on Augmentin for this. Colostomy is functioning fine and she has no issues with applying the bag. Her past medical history significant for diabetes mellitus type 2 SLE, OA, DDD, osteopenia, narcotic contract. Of note she is on chronic doses of prednisone which is 10 mg daily. She has never been a smoker. 10/26/78 -- She had a culture from a wound which grew staph aureus which is MSSA. Her other labs sent and showed a WC count of 5.2 with hemoglobin of 12.2 hematocrit of 37.3 and platelets of 154. The patient also had her BMP done which showed slight elevation of her BUN/creatinine and  her glucose was 167. Hemoglobin A1c was 5.6 Of note her supplies did not arrive and she has not been using Medihoney on her wound. Unfortunately they did not call us to let is know the lack of supplies. We will work with the vendor today to make certain that these reach her ASAP. 11/10/2015 -- she has been doing fine and has no new issues at the present time. Overall she has been doing her dressing as directed and finds this good improvement. Kristen Wallace, Kristen Wallace ([id number]) Objective Constitutional Pulse regular. Respirations normal and unlabored. Afebrile. Vitals Time Taken: 2:08 PM, Height: 65 in, Weight: 165 lbs, BMI: 27.5, Temperature: 97.9 F, Pulse: 68 bpm, Respiratory Rate: 18 breaths/min, Blood Pressure: 137/56 mmHg. Eyes Nonicteric. Reactive to light. Ears, Nose, Mouth, and Throat Lips, teeth, and gums WNL.Marland Kitchen Moist mucosa without lesions . Neck supple and nontender. No palpable supraclavicular or cervical adenopathy. Normal sized without goiter. Respiratory WNL. No retractions.. Cardiovascular Pedal Pulses WNL. No clubbing, cyanosis or edema. Lymphatic No adneopathy. No adenopathy. No adenopathy. Musculoskeletal Adexa without tenderness or enlargement.. Digits and nails w/o clubbing, cyanosis, infection, petechiae, ischemia, or inflammatory conditions.Marland Kitchen Psychiatric Judgement and insight Intact.. No evidence of depression, anxiety, or agitation.. General Notes: no debridement was required today as the edges are looking smooth and there is healthy granulation tissue. Integumentary (Hair, Skin) No suspicious lesions. No crepitus or fluctuance. No peri-wound warmth or erythema. No masses.. Wound #1 status is Open. Original cause of wound was Gradually Appeared. The wound is located on the Left Abdomen - Lower Quadrant. The wound measures 0.9cm length x 0.5cm width x 0.1cm depth; 0.353cm^2 area and 0.035cm^3 volume. The wound is limited to skin breakdown. There is no tunneling  or undermining noted. There is a small amount of serous drainage noted. The wound margin is flat and intact. There is large (67-100%) red granulation within the wound bed. There is a small (1-33%) amount of necrotic tissue within the wound bed including Adherent Slough. The periwound skin appearance exhibited: Moist. Kristen Wallace, Kristen Wallace ([id number]) The periwound skin appearance did not exhibit: Callus, Crepitus, Excoriation, Fluctuance, Friable, Induration, Localized Edema, Rash, Scarring, Dry/Scaly, Maceration, Atrophie Blanche, Cyanosis, Ecchymosis, Hemosiderin Staining, Mottled, Pallor, Rubor, Erythema. Periwound temperature was noted as No Abnormality. 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 Anesthetic: Wound #1 Left Abdomen - Lower Quadrant: Topical Lidocaine 4% cream applied to wound bed prior to debridement Primary Wound Dressing: Wound #1 Left Abdomen - Lower Quadrant: Prisma Ag Secondary Dressing: Wound #1 Left Abdomen - Lower Quadrant: Dry Gauze Dressing Change Frequency: Wound #1 Left Abdomen - Lower Quadrant: Change dressing every day. Follow-up Appointments: Wound #1 Left Abdomen - Lower Quadrant: Return Appointment in 1 week. Kristen Wallace, Kristen Wallace ([id number]) I have recommended we use Prisma Ag and an appropriate dressing over this, to be placed under the wafer of the colostomy bag. It is showing improvement but if it continues to stall we will do a wedge biopsy of the wound edges under local anesthesia in another couple of weeks. Electronic Signature(s) Signed: 11/10/2015 2:30:44 PM By: Kristen KannerBritto, Nettie Cromwell MD, FACS Entered By: Kristen KannerBritto, Adrik Khim on 11/10/2015 14:30:44 Kristen Wallace, Kristen Wallace  ([id number]) -------------------------------------------------------------------------------- SuperBill Details Patient Name: Kristen Wallace, Jariah Date of Service: 11/10/2015 Medical Record Number: [id number] Patient Account Number: [id number] Date of Birth/Sex: Oct 01, 1926 (81 y.o. 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 with unspecified severity K94.00 Colostomy complication, unspecified Facility Procedures CPT4 Code: [id number] Description: [id number] - WOUND CARE VISIT-LEV 2 EST PT Modifier: Quantity: 1 Physician Procedures CPT4 Code Description: [id number] - WC PHYS LEVEL 3 - EST PT ICD-10 Description Diagnosis E11.622 Type 2 diabetes mellitus with other skin ulcer 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

## 2015-11-11 NOTE — Progress Notes (Signed)
HAPPY, BEGEMAN ([id number]) Visit Report for 11/10/2015 Arrival Information Details Patient Name: Kristen Wallace, Kristen Wallace Date of Service: 11/10/2015 2:00 PM Medical Record Number: [id number] Patient Account Number: [id number] Date of Birth/Sex: [birth year] (81 y.o. Female) Treating RN: Curtis Sites Primary Care Physician: Merri Brunette Other Clinician: Referring Physician: Merri Brunette Treating Physician/Extender: Rudene Re in Treatment: 3 Visit Information History Since Last Visit Added or deleted any medications: No Patient Arrived: Walker Any new allergies or adverse reactions: No Arrival Time: 14:06 Had a fall or experienced change in No Accompanied By: dtr activities of daily living that may affect Transfer Assistance: None risk of falls: Patient Identification Verified: Yes Signs or symptoms of abuse/neglect since last No Secondary Verification Process Completed: Yes visito Patient Has Alerts: Yes Hospitalized since last visit: No Patient Alerts: DMII Pain Present Now: No Electronic Signature(s) Signed: 11/10/2015 4:09:06 PM By: Curtis Sites Entered By: Curtis Sites on 11/10/2015 14:08:41 Kristen Wallace ([id number]) -------------------------------------------------------------------------------- Clinic Level of Care Assessment Details Patient Name: Kristen Wallace Date of Service: 11/10/2015 2:00 PM Medical Record Number: [id number] Patient Account Number: [id number] Date of Birth/Sex: 1926-06-28 (81 y.o. Female) Treating RN: Curtis Sites Primary Care Physician: Merri Brunette Other Clinician: Referring Physician: Merri Brunette Treating Physician/Extender: Rudene Re in Treatment: 3 Clinic Level of Care Assessment Items TOOL 4 Quantity Score  - Use when only an EandM is performed on FOLLOW-UP visit 0 ASSESSMENTS - Nursing Assessment / Reassessment X - Reassessment of Co-morbidities (includes updates in patient status) 1 10 X - Reassessment of Adherence  to Treatment Plan 1 5 ASSESSMENTS - Wound and Skin Assessment / Reassessment X - Simple Wound Assessment / Reassessment - one wound 1 5  - Complex Wound Assessment / Reassessment - multiple wounds 0  - Dermatologic / Skin Assessment (not related to wound area) 0 ASSESSMENTS - Focused Assessment  - Circumferential Edema Measurements - multi extremities 0  - Nutritional Assessment / Counseling / Intervention 0  - Lower Extremity Assessment (monofilament, tuning fork, pulses) 0  - Peripheral Arterial Disease Assessment (using hand held doppler) 0 ASSESSMENTS - Ostomy and/or Continence Assessment and Care  - Incontinence Assessment and Management 0  - Ostomy Care Assessment and Management (repouching, etc.) 0 PROCESS - Coordination of Care X - Simple Patient / Family Education for ongoing care 1 15  - Complex (extensive) Patient / Family Education for ongoing care 0  - Staff obtains Chiropractor, Records, Test Results / Process Orders 0  - Staff telephones HHA, Nursing Homes / Clarify orders / etc 0  - Routine Transfer to another Facility (non-emergent condition) 0 Ruffins, Sanae ([id number])  - Routine Hospital Admission (non-emergent condition) 0  - New Admissions / Manufacturing engineer / Ordering NPWT, Apligraf, etc. 0  - Emergency Hospital Admission (emergent condition) 0 X - Simple Discharge Coordination 1 10  - Complex (extensive) Discharge Coordination 0 PROCESS - Special Needs  - Pediatric / Minor Patient Management 0  - Isolation Patient Management 0  - Hearing / Language / Visual special needs 0  - Assessment of Community assistance (transportation, D/C planning, etc.) 0  - Additional assistance / Altered mentation 0  - Support Surface(s) Assessment (bed, cushion, seat, etc.) 0 INTERVENTIONS - Wound Cleansing / Measurement X - Simple Wound Cleansing - one wound 1 5  - Complex Wound Cleansing - multiple wounds 0 X - Wound Imaging  (photographs - any number of wounds) 1 5  - Wound Tracing (instead of photographs) 0 X - Simple Wound Measurement - one wound 1  5 []  - Complex Wound Measurement - multiple wounds 0 INTERVENTIONS - Wound Dressings X - Small Wound Dressing one or multiple wounds 1 10 []  - Medium Wound Dressing one or multiple wounds 0 []  - Large Wound Dressing one or multiple wounds 0 []  - Application of Medications - topical 0 []  - Application of Medications - injection 0 INTERVENTIONS - Miscellaneous []  - External ear exam 0 Boney, Jeanetta ([id number]) []  - Specimen Collection (cultures, biopsies, blood, body fluids, etc.) 0 []  - Specimen(s) / Culture(s) sent or taken to Lab for analysis 0 []  - Patient Transfer (multiple staff / Michiel Sites Lift / Similar devices) 0 []  - Simple Staple / Suture removal (25 or less) 0 []  - Complex Staple / Suture removal (26 or more) 0 []  - Hypo / Hyperglycemic Management (close monitor of Blood Glucose) 0 []  - Ankle / Brachial Index (ABI) - do not check if billed separately 0 X - Vital Signs 1 5 Has the patient been seen at the hospital within the last three years: Yes Total Score: 75 Level Of Care: New/Established - Level 2 Electronic Signature(s) Signed: 11/10/2015 4:09:06 PM By: Curtis Sites Entered By: Curtis Sites on 11/10/2015 14:21:54 Kristen Wallace ([id number]) -------------------------------------------------------------------------------- Encounter Discharge Information Details Patient Name: Kristen Wallace Date of Service: 11/10/2015 2:00 PM Medical Record Number: [id number] Patient Account Number: [id number] Date of Birth/Sex: March 24, 1926 (81 y.o. Female) Treating RN: Curtis Sites Primary Care Physician: Merri Brunette Other Clinician: Referring Physician: Merri Brunette Treating Physician/Extender: Rudene Re in Treatment: 3 Encounter Discharge Information Items Discharge Pain Level: 0 Discharge Condition: Stable Ambulatory Status:  Walker Discharge Destination: Home Transportation: Private Auto Accompanied By: dtr Schedule Follow-up Appointment: Yes Medication Reconciliation completed and provided to Patient/Care No Tristine Langi: Provided on Clinical Summary of Care: 11/10/2015 Form Type Recipient Paper Patient [facility] Electronic Signature(s) Signed: 11/10/2015 2:37:24 PM By: Gwenlyn Perking Entered By: Gwenlyn Perking on 11/10/2015 14:37:24 Kristen Wallace ([id number]) -------------------------------------------------------------------------------- Multi Wound Chart Details Patient Name: Kristen Wallace Date of Service: 11/10/2015 2:00 PM Medical Record Number: [id number] Patient Account Number: [id number] Date of Birth/Sex: [birth year] (81 y.o. Female) Treating RN: Curtis Sites Primary Care Physician: Merri Brunette Other Clinician: Referring Physician: Merri Brunette Treating Physician/Extender: Rudene Re in Treatment: 3 Vital Signs Height(in): 65 Pulse(bpm): 68 Weight(lbs): 165 Blood Pressure 137/56 (mmHg): Body Mass Index(BMI): 27 Temperature(F): 97.9 Respiratory Rate 18 (breaths/min): Photos: [1:No Photos] [N/A:N/A] Wound Location: [1:Left Abdomen - Lower Quadrant] [N/A:N/A] Wounding Event: [1:Gradually Appeared] [N/A:N/A] Primary Etiology: [1:Infection - not elsewhere classified] [N/A:N/A] Comorbid History: [1:Hypertension, Type II Diabetes, Osteoarthritis, Neuropathy] [N/A:N/A] Date Acquired: [1:10/09/2015] [N/A:N/A] Weeks of Treatment: [1:3] [N/A:N/A] Wound Status: [1:Open] [N/A:N/A] Measurements L x W x D 0.9x0.5x0.1 [N/A:N/A] (cm) Area (cm) : [1:0.353] [N/A:N/A] Volume (cm) : [1:0.035] [N/A:N/A] % Reduction in Area: [1:55.00%] [N/A:N/A] % Reduction in Volume: 77.70% [N/A:N/A] Classification: [1:Full Thickness Without Exposed Support Structures] [N/A:N/A] Exudate Amount: [1:Small] [N/A:N/A] Exudate Type: [1:Serous] [N/A:N/A] Exudate Color: [1:amber] [N/A:N/A] Wound Margin: [1:Flat  and Intact] [N/A:N/A] Granulation Amount: [1:Large (67-100%)] [N/A:N/A] Granulation Quality: [1:Red] [N/A:N/A] Necrotic Amount: [1:Small (1-33%)] [N/A:N/A] Exposed Structures: [1:Fascia: No Fat: No] [N/A:N/A] Tendon: No Muscle: No Joint: No Bone: No Limited to Skin Breakdown Epithelialization: Small (1-33%) N/A N/A Periwound Skin Texture: Edema: No N/A N/A Excoriation: No Induration: No Callus: No Crepitus: No Fluctuance: No Friable: No Rash: No Scarring: No Periwound Skin Moist: Yes N/A N/A Moisture: Maceration: No Dry/Scaly: No Periwound Skin Color: Atrophie Blanche: No N/A N/A Cyanosis: No Ecchymosis: No Erythema: No Hemosiderin Staining: No Mottled:  No Pallor: No Rubor: No Temperature: No Abnormality N/A N/A Tenderness on No N/A N/A Palpation: Wound Preparation: Ulcer Cleansing: N/A N/A Rinsed/Irrigated with Saline Topical Anesthetic Applied: None Treatment Notes Electronic Signature(s) Signed: 11/10/2015 4:09:06 PM By: Curtis Sites Entered By: Curtis Sites on 11/10/2015 14:19:21 Kristen Wallace ([id number]) -------------------------------------------------------------------------------- Multi-Disciplinary Care Plan Details Patient Name: Kristen Wallace Date of Service: 11/10/2015 2:00 PM Medical Record Number: [id number] Patient Account Number: [id number] Date of Birth/Sex: Jun 25, 1926 (81 y.o. Female) Treating RN: Curtis Sites Primary Care Physician: Merri Brunette Other Clinician: Referring Physician: Merri Brunette Treating Physician/Extender: Rudene Re in Treatment: 3 Active Inactive Abuse / Safety / Falls / Self Care Management Nursing Diagnoses: Impaired physical mobility Potential for falls Goals: Patient will remain injury free Date Initiated: 10/16/2015 Goal Status: Active Interventions: Assess fall risk on admission and as needed Notes: Orientation to the Wound Care Program Nursing Diagnoses: Knowledge deficit related to the  wound healing center program Goals: Patient/caregiver will verbalize understanding of the Wound Healing Center Program Date Initiated: 10/16/2015 Goal Status: Active Interventions: Provide education on orientation to the wound center Notes: Wound/Skin Impairment Nursing Diagnoses: Impaired tissue integrity Goals: Patient/caregiver will verbalize understanding of skin care regimen EMANIE, BEHAN ([id number]) Date Initiated: 10/16/2015 Goal Status: Active Ulcer/skin breakdown will have a volume reduction of 30% by week 4 Date Initiated: 10/16/2015 Goal Status: Active Ulcer/skin breakdown will have a volume reduction of 50% by week 8 Date Initiated: 10/16/2015 Goal Status: Active Ulcer/skin breakdown will have a volume reduction of 80% by week 12 Date Initiated: 10/16/2015 Goal Status: Active Ulcer/skin breakdown will heal within 14 weeks Date Initiated: 10/16/2015 Goal Status: Active Interventions: Assess ulceration(s) every visit Notes: Electronic Signature(s) Signed: 11/10/2015 4:09:06 PM By: Curtis Sites Entered By: Curtis Sites on 11/10/2015 14:19:13 Kristen Wallace ([id number]) -------------------------------------------------------------------------------- Patient/Caregiver Education Details Patient Name: Kristen Wallace Date of Service: 11/10/2015 2:00 PM Medical Record Number: [id number] Patient Account Number: [id number] Date of Birth/Gender: December 21, 1925 (81 y.o. Female) Treating RN: Curtis Sites Primary Care Physician: Merri Brunette Other Clinician: Referring Physician: Merri Brunette Treating Physician/Extender: Rudene Re in Treatment: 3 Education Assessment Education Provided To: Patient and Caregiver Education Topics Provided Wound/Skin Impairment: Handouts: Other: wound care as ordered Methods: Demonstration, Explain/Verbal Responses: State content correctly Electronic Signature(s) Signed: 11/10/2015 4:09:06 PM By: Curtis Sites Entered By:  Curtis Sites on 11/10/2015 14:22:52 Kristen Wallace ([id number]) -------------------------------------------------------------------------------- Wound Assessment Details Patient Name: Kristen Wallace Date of Service: 11/10/2015 2:00 PM Medical Record Number: [id number] Patient Account Number: [id number] Date of Birth/Sex: 10-09-1926 (81 y.o. Female) Treating RN: Curtis Sites Primary Care Physician: Merri Brunette Other Clinician: Referring Physician: Merri Brunette Treating Physician/Extender: Rudene Re in Treatment: 3 Wound Status Wound Number: 1 Primary Infection - not elsewhere classified Etiology: Wound Location: Left Abdomen - Lower Quadrant Wound Open Status: Wounding Event: Gradually Appeared Comorbid Hypertension, Type II Diabetes, Date Acquired: 10/09/2015 History: Osteoarthritis, Neuropathy Weeks Of Treatment: 3 Clustered Wound: No Photos Photo Uploaded By: Curtis Sites on 11/10/2015 14:57:59 Wound Measurements Length: (cm) 0.9 Width: (cm) 0.5 Depth: (cm) 0.1 Area: (cm) 0.353 Volume: (cm) 0.035 % Reduction in Area: 55% % Reduction in Volume: 77.7% Epithelialization: Small (1-33%) Tunneling: No Undermining: No Wound Description Full Thickness Without Exposed Foul Odor Af Classification: Support Structures Wound Margin: Flat and Intact Exudate Small Amount: Exudate Type: Serous Exudate Color: amber ter Cleansing: No Wound Bed Granulation Amount: Large (67-100%) Exposed Structure Granulation Quality: Red Fascia Exposed: No Beale, Nikyla ([id number]) Necrotic Amount: Small (1-33%) Fat Layer Exposed: No Necrotic Quality: Adherent  Slough Tendon Exposed: No Muscle Exposed: No Joint Exposed: No Bone Exposed: No Limited to Skin Breakdown Periwound Skin Texture Texture Color No Abnormalities Noted: No No Abnormalities Noted: No Callus: No Atrophie Blanche: No Crepitus: No Cyanosis: No Excoriation: No Ecchymosis: No Fluctuance:  No Erythema: No Friable: No Hemosiderin Staining: No Induration: No Mottled: No Localized Edema: No Pallor: No Rash: No Rubor: No Scarring: No Temperature / Pain Moisture Temperature: No Abnormality No Abnormalities Noted: No Dry / Scaly: No Maceration: No Moist: Yes Wound Preparation Ulcer Cleansing: Rinsed/Irrigated with Saline Topical Anesthetic Applied: None Treatment Notes Wound #1 (Left Abdomen - Lower Quadrant) 1. Cleansed with: Clean wound with Normal Saline 4. Dressing Applied: Prisma Ag 5. Secondary Dressing Applied Dry Gauze 7. Secured with Tape Notes Covered with the wafer from the colostomy. Electronic Signature(s) Signed: 11/10/2015 4:09:06 PM By: Curtis Sitesorthy, Joanna Entered By: Curtis Sitesorthy, Joanna on 11/10/2015 14:15:09 Kristen BakeHUDAK, Illyana ([id number] Wallace) Kuklinski, Paizlee ([id number]) -------------------------------------------------------------------------------- Vitals Details Patient Name: Kristen BakeHUDAK, Flor Date of Service: 11/10/2015 2:00 PM Medical Record Number: [id number] Patient Account Number: [id number] Date of Birth/Sex: 11-18-1926 (81 y.o. Female) Treating RN: Curtis Sitesorthy, Joanna Primary Care Physician: Merri BrunettePHARR, WALTER Other Clinician: Referring Physician: Merri BrunettePHARR, WALTER Treating Physician/Extender: Rudene ReBritto, Errol Weeks in Treatment: 3 Vital Signs Time Taken: 14:08 Temperature (F): 97.9 Height (in): 65 Pulse (bpm): 68 Weight (lbs): 165 Respiratory Rate (breaths/min): 18 Body Mass Index (BMI): 27.5 Blood Pressure (mmHg): 137/56 Reference Range: 80 - 120 mg / dl Electronic Signature(s) Signed: 11/10/2015 4:09:06 PM By: Curtis Sitesorthy, Joanna Entered By: Curtis Sitesorthy, Joanna on 11/10/2015 14:09:58

## 2015-11-17 ENCOUNTER — Encounter: Payer: Medicare Other | Admitting: Surgery

## 2015-11-17 DIAGNOSIS — E11622 Type 2 diabetes mellitus with other skin ulcer: Secondary | ICD-10-CM | POA: Diagnosis not present

## 2015-11-18 NOTE — Progress Notes (Signed)
Kristen Wallace, Kristen Wallace ([id number]) Visit Report for 11/17/2015 Arrival Information Details Patient Name: Kristen Wallace Date of Service: 11/17/2015 2:45 PM Medical Record Number: [id number] Patient Account Number: [id number] Date of Birth/Sex: [birth year] (81 y.o. Female) Treating RN: Afful, RN, BSN, [place] Sink Primary Care Physician: Merri Brunette Other Clinician: Referring Physician: Merri Brunette Treating Physician/Extender: Rudene Re in Treatment: 4 Visit Information History Since Last Visit Added or deleted any medications: No Patient Arrived: Walker Any new allergies or adverse reactions: No Arrival Time: 15:09 Had a fall or experienced change in No Accompanied By: dtr activities of daily living that may affect Transfer Assistance: None risk of falls: Patient Identification Verified: Yes Signs or symptoms of abuse/neglect since last No Secondary Verification Process Completed: Yes visito Patient Has Alerts: Yes Has Dressing in Place as Prescribed: Yes Patient Alerts: DMII Pain Present Now: No Electronic Signature(s) Signed: 11/17/2015 4:12:03 PM By: Elpidio Eric BSN, RN Entered By: Elpidio Eric on 11/17/2015 15:10:02 Kristen Wallace ([id number]) -------------------------------------------------------------------------------- Clinic Level of Care Assessment Details Patient Name: Kristen Wallace Date of Service: 11/17/2015 2:45 PM Medical Record Number: [id number] Patient Account Number: [id number] Date of Birth/Sex: [birth year] (81 y.o. Female) Treating RN: Curtis Sites Primary Care Physician: Merri Brunette Other Clinician: Referring Physician: Merri Brunette Treating Physician/Extender: Rudene Re in Treatment: 4 Clinic Level of Care Assessment Items TOOL 4 Quantity Score  - Use when only an EandM is performed on FOLLOW-UP visit 0 ASSESSMENTS - Nursing Assessment / Reassessment X - Reassessment of Co-morbidities (includes updates in patient status) 1 10 X - Reassessment  of Adherence to Treatment Plan 1 5 ASSESSMENTS - Wound and Skin Assessment / Reassessment X - Simple Wound Assessment / Reassessment - one wound 1 5  - Complex Wound Assessment / Reassessment - multiple wounds 0  - Dermatologic / Skin Assessment (not related to wound area) 0 ASSESSMENTS - Focused Assessment  - Circumferential Edema Measurements - multi extremities 0  - Nutritional Assessment / Counseling / Intervention 0  - Lower Extremity Assessment (monofilament, tuning fork, pulses) 0  - Peripheral Arterial Disease Assessment (using hand held doppler) 0 ASSESSMENTS - Ostomy and/or Continence Assessment and Care  - Incontinence Assessment and Management 0  - Ostomy Care Assessment and Management (repouching, etc.) 0 PROCESS - Coordination of Care X - Simple Patient / Family Education for ongoing care 1 15  - Complex (extensive) Patient / Family Education for ongoing care 0  - Staff obtains Chiropractor, Records, Test Results / Process Orders 0  - Staff telephones HHA, Nursing Homes / Clarify orders / etc 0  - Routine Transfer to another Facility (non-emergent condition) 0 Kristen Wallace, Kristen Wallace ([id number])  - Routine Hospital Admission (non-emergent condition) 0  - New Admissions / Manufacturing engineer / Ordering NPWT, Apligraf, etc. 0  - Emergency Hospital Admission (emergent condition) 0 X - Simple Discharge Coordination 1 10  - Complex (extensive) Discharge Coordination 0 PROCESS - Special Needs  - Pediatric / Minor Patient Management 0  - Isolation Patient Management 0  - Hearing / Language / Visual special needs 0  - Assessment of Community assistance (transportation, D/C planning, etc.) 0  - Additional assistance / Altered mentation 0  - Support Surface(s) Assessment (bed, cushion, seat, etc.) 0 INTERVENTIONS - Wound Cleansing / Measurement X - Simple Wound Cleansing - one wound 1 5  - Complex Wound Cleansing - multiple wounds 0 X - Wound  Imaging (photographs - any number of wounds) 1 5  - Wound Tracing (instead of photographs) 0 X - Simple  Wound Measurement - one wound 1 5 []  - Complex Wound Measurement - multiple wounds 0 INTERVENTIONS - Wound Dressings X - Small Wound Dressing one or multiple wounds 1 10 []  - Medium Wound Dressing one or multiple wounds 0 []  - Large Wound Dressing one or multiple wounds 0 []  - Application of Medications - topical 0 []  - Application of Medications - injection 0 INTERVENTIONS - Miscellaneous []  - External ear exam 0 Kristen Wallace, Kristen Wallace ([id number]) []  - Specimen Collection (cultures, biopsies, blood, body fluids, etc.) 0 []  - Specimen(s) / Culture(s) sent or taken to Lab for analysis 0 []  - Patient Transfer (multiple staff / Michiel SitesHoyer Lift / Similar devices) 0 []  - Simple Staple / Suture removal (25 or less) 0 []  - Complex Staple / Suture removal (26 or more) 0 []  - Hypo / Hyperglycemic Management (close monitor of Blood Glucose) 0 []  - Ankle / Brachial Index (ABI) - do not check if billed separately 0 X - Vital Signs 1 5 Has the patient been seen at the hospital within the last three years: Yes Total Score: 75 Level Of Care: New/Established - Level 2 Electronic Signature(s) Signed: 11/17/2015 4:41:48 PM By: Curtis Sitesorthy, Joanna Entered By: Curtis Sitesorthy, Joanna on 11/17/2015 15:22:38 Kristen Wallace, Kristen Wallace ([id number]) -------------------------------------------------------------------------------- Encounter Discharge Information Details Patient Name: Kristen Wallace, Kristen Wallace Date of Service: 11/17/2015 2:45 PM Medical Record Number: [id number] Patient Account Number: [id number] Date of Birth/Sex: 1926-06-12 (81 y.o. Female) Treating RN: Curtis Sitesorthy, Joanna Primary Care Physician: Merri BrunettePHARR, WALTER Other Clinician: Referring Physician: Merri BrunettePHARR, WALTER Treating Physician/Extender: Rudene ReBritto, Errol Weeks in Treatment: 4 Encounter Discharge Information Items Discharge Pain Level: 0 Discharge Condition: Stable Ambulatory Status:  Walker Discharge Destination: Home Transportation: Private Auto Accompanied By: dtr Schedule Follow-up Appointment: Yes Medication Reconciliation completed and provided to Patient/Care No Duglas Heier: Provided on Clinical Summary of Care: 11/17/2015 Form Type Recipient Paper Patient [facility] Electronic Signature(s) Signed: 11/17/2015 3:49:07 PM By: Gwenlyn PerkingMoore, Shelia Entered By: Gwenlyn PerkingMoore, Shelia on 11/17/2015 15:49:06 Kristen Wallace, Kristen Wallace ([id number]) -------------------------------------------------------------------------------- Lower Extremity Assessment Details Patient Name: Kristen Wallace, Kristen Wallace Date of Service: 11/17/2015 2:45 PM Medical Record Number: [id number] Patient Account Number: [id number] Date of Birth/Sex: 1926-06-12 (81 y.o. Female) Treating RN: Afful, RN, BSN, [place] Sinkita Primary Care Physician: Merri BrunettePHARR, WALTER Other Clinician: Referring Physician: Merri BrunettePHARR, WALTER Treating Physician/Extender: Rudene ReBritto, Errol Weeks in Treatment: 4 Electronic Signature(s) Signed: 11/17/2015 4:12:03 PM By: Elpidio EricAfful, Rita BSN, RN Entered By: Elpidio EricAfful, Rita on 11/17/2015 15:10:27 Kristen Wallace, Kristen Wallace ([id number]) -------------------------------------------------------------------------------- Multi Wound Chart Details Patient Name: Kristen Wallace, Kristen Wallace Date of Service: 11/17/2015 2:45 PM Medical Record Number: [id number] Patient Account Number: [id number] Date of Birth/Sex: 1926-06-12 (81 y.o. Female) Treating RN: Clover MealyAfful, RN, BSN, [place] Sinkita Primary Care Physician: Merri BrunettePHARR, WALTER Other Clinician: Referring Physician: Merri BrunettePHARR, WALTER Treating Physician/Extender: Rudene ReBritto, Errol Weeks in Treatment: 4 Vital Signs Height(in): 65 Pulse(bpm): 77 Weight(lbs): 165 Blood Pressure 153/61 (mmHg): Body Mass Index(BMI): 27 Temperature(F): 98.1 Respiratory Rate 18 (breaths/min): Photos: [1:No Photos] [N/A:N/A] Wound Location: [1:Left Abdomen - Lower Quadrant] [N/A:N/A] Wounding Event: [1:Gradually Appeared] [N/A:N/A] Primary Etiology: [1:Infection - not  elsewhere classified] [N/A:N/A] Comorbid History: [1:Hypertension, Type II Diabetes, Osteoarthritis, Neuropathy] [N/A:N/A] Date Acquired: [1:10/09/2015] [N/A:N/A] Weeks of Treatment: [1:4] [N/A:N/A] Wound Status: [1:Open] [N/A:N/A] Measurements L x W x D 0.8x0.4x0.1 [N/A:N/A] (cm) Area (cm) : [1:0.251] [N/A:N/A] Volume (cm) : [1:0.025] [N/A:N/A] % Reduction in Area: [1:68.00%] [N/A:N/A] % Reduction in Volume: 84.10% [N/A:N/A] Classification: [1:Full Thickness Without Exposed Support Structures] [N/A:N/A] Exudate Amount: [1:Small] [N/A:N/A] Exudate Type: [1:Serous] [N/A:N/A] Exudate Color: [1:amber] [N/A:N/A] Wound Margin: [1:Flat and Intact] [N/A:N/A] Granulation Amount: [1:Large (67-100%)] [N/A:N/A] Granulation  Quality: [1:Red] [N/A:N/A] Necrotic Amount: [1:Small (1-33%)] [N/A:N/A] Exposed Structures: [1:Fascia: No Fat: No] [N/A:N/A] Tendon: No Muscle: No Joint: No Bone: No Limited to Skin Breakdown Epithelialization: Small (1-33%) N/A N/A Periwound Skin Texture: Edema: No N/A N/A Excoriation: No Induration: No Callus: No Crepitus: No Fluctuance: No Friable: No Rash: No Scarring: No Periwound Skin Moist: Yes N/A N/A Moisture: Maceration: No Dry/Scaly: No Periwound Skin Color: Atrophie Blanche: No N/A N/A Cyanosis: No Ecchymosis: No Erythema: No Hemosiderin Staining: No Mottled: No Pallor: No Rubor: No Temperature: No Abnormality N/A N/A Tenderness on No N/A N/A Palpation: Wound Preparation: Ulcer Cleansing: N/A N/A Rinsed/Irrigated with Saline Topical Anesthetic Applied: None Treatment Notes Electronic Signature(s) Signed: 11/17/2015 4:12:03 PM By: Elpidio Eric BSN, RN Entered By: Elpidio Eric on 11/17/2015 15:17:12 Kristen Wallace ([id number]) -------------------------------------------------------------------------------- Multi-Disciplinary Care Plan Details Patient Name: Kristen Wallace Date of Service: 11/17/2015 2:45 PM Medical Record Number:  [id number] Patient Account Number: [id number] Date of Birth/Sex: [birth year] (81 y.o. Female) Treating RN: Afful, RN, BSN, [place] Sink Primary Care Physician: Merri Brunette Other Clinician: Referring Physician: Merri Brunette Treating Physician/Extender: Rudene Re in Treatment: 4 Active Inactive Abuse / Safety / Falls / Self Care Management Nursing Diagnoses: Impaired physical mobility Potential for falls Goals: Patient will remain injury free Date Initiated: 10/16/2015 Goal Status: Active Interventions: Assess fall risk on admission and as needed Notes: Orientation to the Wound Care Program Nursing Diagnoses: Knowledge deficit related to the wound healing center program Goals: Patient/caregiver will verbalize understanding of the Wound Healing Center Program Date Initiated: 10/16/2015 Goal Status: Active Interventions: Provide education on orientation to the wound center Notes: Wound/Skin Impairment Nursing Diagnoses: Impaired tissue integrity Goals: Patient/caregiver will verbalize understanding of skin care regimen Kristen Wallace, Kristen Wallace ([id number]) Date Initiated: 10/16/2015 Goal Status: Active Ulcer/skin breakdown will have a volume reduction of 30% by week 4 Date Initiated: 10/16/2015 Goal Status: Active Ulcer/skin breakdown will have a volume reduction of 50% by week 8 Date Initiated: 10/16/2015 Goal Status: Active Ulcer/skin breakdown will have a volume reduction of 80% by week 12 Date Initiated: 10/16/2015 Goal Status: Active Ulcer/skin breakdown will heal within 14 weeks Date Initiated: 10/16/2015 Goal Status: Active Interventions: Assess ulceration(s) every visit Notes: Electronic Signature(s) Signed: 11/17/2015 4:12:03 PM By: Elpidio Eric BSN, RN Entered By: Elpidio Eric on 11/17/2015 15:17:04 Kristen Wallace ([id number]) -------------------------------------------------------------------------------- Pain Assessment Details Patient Name: Kristen Wallace Date of  Service: 11/17/2015 2:45 PM Medical Record Number: [id number] Patient Account Number: [id number] Date of Birth/Sex: Mar 24, 1926 (81 y.o. Female) Treating RN: Clover Mealy, RN, BSN, [place] Sink Primary Care Physician: Merri Brunette Other Clinician: Referring Physician: Merri Brunette Treating Physician/Extender: Rudene Re in Treatment: 4 Active Problems Location of Pain Severity and Description of Pain Patient Has Paino No Site Locations Pain Management and Medication Current Pain Management: Electronic Signature(s) Signed: 11/17/2015 4:12:03 PM By: Elpidio Eric BSN, RN Entered By: Elpidio Eric on 11/17/2015 15:10:09 Kristen Wallace ([id number]) -------------------------------------------------------------------------------- Patient/Caregiver Education Details Patient Name: Kristen Wallace Date of Service: 11/17/2015 2:45 PM Medical Record Number: [id number] Patient Account Number: [id number] Date of Birth/Gender: 07/13/1926 (81 y.o. Female) Treating RN: Curtis Sites Primary Care Physician: Merri Brunette Other Clinician: Referring Physician: Merri Brunette Treating Physician/Extender: Rudene Re in Treatment: 4 Education Assessment Education Provided To: Patient and Caregiver Education Topics Provided Wound/Skin Impairment: Handouts: Other: continue wtih wound care as ordered Methods: Demonstration, Explain/Verbal Responses: State content correctly Electronic Signature(s) Signed: 11/17/2015 4:41:48 PM By: Curtis Sites Entered By: Curtis Sites on 11/17/2015 15:23:45 Kristen Wallace ([id number]) -------------------------------------------------------------------------------- Wound Assessment Details Patient Name:  Kristen Wallace Date of Service: 11/17/2015 2:45 PM Medical Record Number: [id number] Patient Account Number: [id number] Date of Birth/Sex: 1926-08-19 (81 y.o. Female) Treating RN: Afful, RN, BSN, Rita Primary Care Physician: Merri Brunette Other Clinician: Referring  Physician: Merri Brunette Treating Physician/Extender: Rudene Re in Treatment: 4 Wound Status Wound Number: 1 Primary Infection - not elsewhere classified Etiology: Wound Location: Left Abdomen - Lower Quadrant Wound Open Status: Wounding Event: Gradually Appeared Comorbid Hypertension, Type II Diabetes, Date Acquired: 10/09/2015 History: Osteoarthritis, Neuropathy Weeks Of Treatment: 4 Clustered Wound: No Photos Photo Uploaded By: Curtis Sites on 11/17/2015 16:02:11 Wound Measurements Length: (cm) 0.8 Width: (cm) 0.4 Depth: (cm) 0.1 Area: (cm) 0.251 Volume: (cm) 0.025 % Reduction in Area: 68% % Reduction in Volume: 84.1% Epithelialization: Small (1-33%) Tunneling: No Undermining: No Wound Description Full Thickness Without Exposed Foul Odor Af Classification: Support Structures Wound Margin: Flat and Intact Exudate Small Amount: Exudate Type: Serous Exudate Color: amber ter Cleansing: No Wound Bed Granulation Amount: Large (67-100%) Exposed Structure Granulation Quality: Red Fascia Exposed: No Gang, Terrel ([id number]) Necrotic Amount: Small (1-33%) Fat Layer Exposed: No Necrotic Quality: Adherent Slough Tendon Exposed: No Muscle Exposed: No Joint Exposed: No Bone Exposed: No Limited to Skin Breakdown Periwound Skin Texture Texture Color No Abnormalities Noted: No No Abnormalities Noted: No Callus: No Atrophie Blanche: No Crepitus: No Cyanosis: No Excoriation: No Ecchymosis: No Fluctuance: No Erythema: No Friable: No Hemosiderin Staining: No Induration: No Mottled: No Localized Edema: No Pallor: No Rash: No Rubor: No Scarring: No Temperature / Pain Moisture Temperature: No Abnormality No Abnormalities Noted: No Dry / Scaly: No Maceration: No Moist: Yes Wound Preparation Ulcer Cleansing: Rinsed/Irrigated with Saline Topical Anesthetic Applied: None Treatment Notes Wound #1 (Left Abdomen - Lower Quadrant) 1. Cleansed  with: Clean wound with Normal Saline 3. Peri-wound Care: Skin Prep 4. Dressing Applied: Prisma Ag 5. Secondary Dressing Applied Dry Gauze 7. Secured with Tape Notes Covered with the wafer from the colostomy. Electronic Signature(s) Signed: 11/17/2015 4:12:03 PM By: Elpidio Eric BSN, RN Kristen Wallace, Myriam Jacobson ([id number]) Entered By: Elpidio Eric on 11/17/2015 15:16:55 Kristen Wallace ([id number]) -------------------------------------------------------------------------------- Vitals Details Patient Name: Kristen Wallace Date of Service: 11/17/2015 2:45 PM Medical Record Number: [id number] Patient Account Number: [id number] Date of Birth/Sex: 17-Apr-1926 (81 y.o. Female) Treating RN: Afful, RN, BSN, Rita Primary Care Physician: Merri Brunette Other Clinician: Referring Physician: Merri Brunette Treating Physician/Extender: Rudene Re in Treatment: 4 Vital Signs Time Taken: 14:55 Temperature (F): 98.1 Height (in): 65 Pulse (bpm): 77 Weight (lbs): 165 Respiratory Rate (breaths/min): 18 Body Mass Index (BMI): 27.5 Blood Pressure (mmHg): 153/61 Reference Range: 80 - 120 mg / dl Electronic Signature(s) Signed: 11/17/2015 4:12:03 PM By: Elpidio Eric BSN, RN Entered By: Elpidio Eric on 11/17/2015 15:12:58

## 2015-11-18 NOTE — Progress Notes (Signed)
Kristen Wallace, Kristen Wallace ([id number]) Visit Report for 11/17/2015 Chief Complaint Document Details Patient Name: Kristen Wallace, Kristen Wallace 11/17/2015 2:45 Date of Service: PM Medical Record [id number] Number: Patient Account Number: [id number] 1926-02-27 (81 y.o. Treating RN: Curtis Sitesorthy, Joanna Date of Birth/Sex: Female) Other Clinician: Primary Care Physician: Ronnald NianPHARR, WALTER Treating Jonnie Kubly Referring Physician: Merri BrunettePHARR, WALTER Physician/Extender: Tania AdeWeeks in Treatment: 4 Information Obtained from: Patient Chief Complaint Patient presents to the wound care center for a consult due non healing wound she had a nodule above the left lower quadrant colostomy for about 6 months which has now opened into an ulcer for the last week. Electronic Signature(s) Signed: 11/17/2015 3:28:08 PM By: Evlyn KannerBritto, Ahrianna Siglin MD, FACS Entered By: Evlyn KannerBritto, Saajan Willmon on 11/17/2015 15:28:08 Kristen Wallace, Kristen Wallace ([id number]) -------------------------------------------------------------------------------- HPI Details Patient Name: Kristen Wallace, Kristen Wallace 11/17/2015 2:45 Date of Service: PM Medical Record [id number] Number: Patient Account Number: [id number] 1926-02-27 (81 y.o. Treating RN: Curtis Sitesorthy, Joanna Date of Birth/Sex: Female) Other Clinician: Primary Care Physician: Merri BrunettePHARR, WALTER Treating Evlyn KannerBritto, Lacole Komorowski Referring Physician: Merri BrunettePHARR, WALTER Physician/Extender: Weeks in Treatment: 4 History of Present Illness Location: ulcerative area just above the colostomy opening in the left lower quadrant Quality: Patient reports No Pain. Severity: Patient states wound (s) are getting better. Duration: Patient has had the wound for > 6 months prior to seeking treatment at the wound center Context: The wound appeared gradually over time. it was a nodule initially and gradually broke down into an open ulcer. Modifying Factors: Other treatment(s) tried include: her PCP has put her on Augmentin recently Associated Signs and Symptoms: Wound has no purlulent drainage and her  colostomy is functioning fine HPI Description: Very pleasant 81 year old patient whose had a nodule just above the stoma and the left lower quadrant where she's had a colostomy for about 17-1/2 years. About a week ago the nodule opened into an ulcerated area and she was recently put on Augmentin for this. Colostomy is functioning fine and she has no issues with applying the bag. Her past medical history significant for diabetes mellitus type 2 SLE, OA, DDD, osteopenia, narcotic contract. Of note she is on chronic doses of prednisone which is 10 mg daily. She has never been a smoker. 10/27/2015 -- She had a culture from a wound which grew staph aureus which is MSSA. Her other labs sent and showed a WC count of 5.2 with hemoglobin of 12.2 hematocrit of 37.3 and platelets of 154. The patient also had her BMP done which showed slight elevation of her BUN/creatinine and her glucose was 167. Hemoglobin A1c was 5.6 Of note her supplies did not arrive and she has not been using Medihoney on her wound. Unfortunately they did not call us to let is know the lack of supplies. We will work with the vendor today to make certain that these reach her ASAP. 11/10/2015 -- she has been doing fine and has no new issues at the present time. Overall she has been doing her dressing as directed and finds this good improvement. Electronic Signature(s) Signed: 11/17/2015 3:28:18 PM By: Evlyn KannerBritto, Jaidynn Balster MD, FACS Entered By: Evlyn KannerBritto, Harel Repetto on 11/17/2015 15:28:18 Kristen Wallace, Kristen Wallace ([id number]) -------------------------------------------------------------------------------- Physical Exam Details Patient Name: Kristen Wallace, Kristen Wallace 11/17/2015 2:45 Date of Service: PM Medical Record [id number] Number: Patient Account Number: [id number] 1926-02-27 (81 y.o. Treating RN: Curtis Sitesorthy, Joanna Date of Birth/Sex: Female) Other Clinician: Primary Care Physician: Merri BrunettePHARR, WALTER Treating Evlyn KannerBritto, Karuna Balducci Referring Physician: Merri BrunettePHARR,  WALTER Physician/Extender: Weeks in Treatment: 4 Constitutional . Pulse regular. Respirations normal and unlabored. Afebrile. . Eyes Nonicteric. Reactive to light. Ears, Nose, Mouth,  and Throat Lips, teeth, and gums WNL.Marland Kitchen Moist mucosa without lesions. Neck supple and nontender. No palpable supraclavicular or cervical adenopathy. Normal sized without goiter. Respiratory WNL. No retractions.. Cardiovascular Pedal Pulses WNL. No clubbing, cyanosis or edema. Lymphatic No adneopathy. No adenopathy. No adenopathy. Musculoskeletal Adexa without tenderness or enlargement.. Digits and nails w/o clubbing, cyanosis, infection, petechiae, ischemia, or inflammatory conditions.. Integumentary (Hair, Skin) No suspicious lesions. No crepitus or fluctuance. No peri-wound warmth or erythema. No masses.Marland Kitchen Psychiatric Judgement and insight Intact.. No evidence of depression, anxiety, or agitation.. Notes the wound is looking excellent today with healthy granulation tissue and no evidence of any undermining. Electronic Signature(s) Signed: 11/17/2015 3:28:47 PM By: Evlyn Kanner MD, FACS Entered By: Evlyn Kanner on 11/17/2015 15:28:46 CLARIS, PECH ([id number]) -------------------------------------------------------------------------------- Physician Orders Details Patient Name: Kristen Wallace, Kristen Wallace 11/17/2015 2:45 Date of Service: PM Medical Record [id number] Number: Patient Account Number: [id number] 12-Mar-1926 (81 y.o. Treating RN: Curtis Sites Date of Birth/Sex: Female) Other Clinician: Primary Care Physician: Ronnald Nian Referring Physician: Merri Brunette Physician/Extender: Tania Ade in Treatment: 4 Verbal / Phone Orders: Yes Clinician: Curtis Sites Read Back and Verified: Yes Diagnosis Coding Wound Cleansing Wound #1 Left Abdomen - Lower Quadrant o Clean wound with Normal Saline. Anesthetic Wound #1 Left Abdomen - Lower Quadrant o Topical Lidocaine 4% cream  applied to wound bed prior to debridement Primary Wound Dressing Wound #1 Left Abdomen - Lower Quadrant o Prisma Ag Secondary Dressing Wound #1 Left Abdomen - Lower Quadrant o Dry Gauze Dressing Change Frequency Wound #1 Left Abdomen - Lower Quadrant o Change dressing every day. Follow-up Appointments Wound #1 Left Abdomen - Lower Quadrant o Return Appointment in 2 weeks. Electronic Signature(s) Signed: 11/17/2015 4:08:32 PM By: Evlyn Kanner MD, FACS Signed: 11/17/2015 4:41:48 PM By: Curtis Sites Entered By: Curtis Sites on 11/17/2015 15:22:08 Kristen Bake ([id number]) -------------------------------------------------------------------------------- Problem List Details Patient Name: Kristen Wallace, Kristen Wallace 11/17/2015 2:45 Date of Service: PM Medical Record [id number] Number: Patient Account Number: [id number] 1926/07/25 (81 y.o. 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 Treating RN: Curtis Sites Date of Birth/Sex: Female) Other Clinician: Primary Care Physician:  Ronnald Nian Referring Physician: Merri Brunette Physician/Extender: Tania Ade in Treatment: 4 Subjective Chief Complaint Information obtained from Patient Patient presents to the wound care center for a consult due non healing wound she had a nodule above the left lower quadrant colostomy for about 6 months which has now opened into an ulcer for the last week. History of Present Illness (HPI) The following HPI elements were documented for the patient's wound: Location: ulcerative area just above the colostomy opening in the left lower quadrant Quality: Patient reports No Pain. Severity: Patient states wound (s) are getting better. Duration: Patient has had the wound for > 6 months prior to seeking treatment at the wound center Context: The wound appeared gradually over time. it was  a nodule initially and gradually broke down into an open ulcer. Modifying Factors: Other treatment(s) tried include: her PCP has put her on Augmentin recently Associated Signs and Symptoms: Wound has no purlulent drainage and her colostomy is functioning fine Very pleasant 81 year old patient whose had a nodule just above the stoma and the left lower quadrant where she's had a colostomy for about 17-1/2 years. About a week ago the nodule opened into an ulcerated area and she was recently put on Augmentin for this. Colostomy is functioning fine and she has no issues with applying the bag. Her past medical history significant for diabetes mellitus type 2 SLE, OA, DDD, osteopenia, narcotic contract. Of note she is on chronic doses of prednisone which is 10 mg daily. She has never been a smoker. 10/27/2015 -- She had a culture from a wound which grew staph aureus which is MSSA. Her other labs sent and showed a WC count of 5.2 with hemoglobin of 12.2 hematocrit of 37.3 and platelets of 154. The patient also had her BMP done which showed slight elevation of her BUN/creatinine and her glucose was  167. Hemoglobin A1c was 5.6 Of note her supplies did not arrive and she has not been using Medihoney on her wound. Unfortunately they did not call us to let is know the lack of supplies. We will work with the vendor today to make certain that these reach her ASAP. 11/10/2015 -- she has been doing fine and has no new issues at the present time. Overall she has been doing her dressing as directed and finds this good improvement. Kristen Wallace, Kristen Wallace ([id number]) Objective Constitutional Pulse regular. Respirations normal and unlabored. Afebrile. Vitals Time Taken: 2:55 PM, Height: 65 in, Weight: 165 lbs, BMI: 27.5, Temperature: 98.1 F, Pulse: 77 bpm, Respiratory Rate: 18 breaths/min, Blood Pressure: 153/61 mmHg. Eyes Nonicteric. Reactive to light. Ears, Nose, Mouth, and Throat Lips, teeth, and gums WNL.Marland Kitchen Moist mucosa without lesions. Neck supple and nontender. No palpable supraclavicular or cervical adenopathy. Normal sized without goiter. Respiratory WNL. No retractions.. Cardiovascular Pedal Pulses WNL. No clubbing, cyanosis or edema. Lymphatic No adneopathy. No adenopathy. No adenopathy. Musculoskeletal Adexa without tenderness or enlargement.. Digits and nails w/o clubbing, cyanosis, infection, petechiae, ischemia, or inflammatory conditions.Marland Kitchen Psychiatric Judgement and insight Intact.. No evidence of depression, anxiety, or agitation.. General Notes: the wound is looking excellent today with healthy granulation tissue and no evidence of any undermining. Integumentary (Hair, Skin) No suspicious lesions. No crepitus or fluctuance. No peri-wound warmth or erythema. No masses.. Wound #1 status is Open. Original cause of wound was Gradually Appeared. The wound is located on the Left Abdomen - Lower Quadrant. The wound measures 0.8cm length x 0.4cm width x 0.1cm depth; 0.251cm^2 area and 0.025cm^3 volume. The wound is limited to skin breakdown. There is no tunneling or undermining noted.  There is a small amount of serous drainage noted. The wound margin is flat and intact. There is large (67-100%) red granulation within the wound bed. There is a small (1-33%) amount of necrotic tissue within the wound bed including Adherent Slough. The periwound skin appearance exhibited: Moist. Kristen Wallace, Latanja ([id number]) The periwound skin appearance did not exhibit: Callus, Crepitus, Excoriation, Fluctuance, Friable, Induration, Localized Edema, Rash, Scarring, Dry/Scaly, Maceration, Atrophie Blanche, Cyanosis, Ecchymosis, Hemosiderin Staining, Mottled, Pallor, Rubor, Erythema. Periwound temperature was noted as No Abnormality. Assessment Active Problems ICD-10 E11.622 - Type 2 diabetes mellitus with other skin ulcer L98.499 - Non-pressure chronic ulcer of skin of other sites with unspecified severity K94.00 -  Colostomy complication, unspecified We will continue application of Prisma AG and a small dressing under her colostomy wafer. Due to the holiday she will come back and see me in 2 weeks' time. Plan Wound Cleansing: Wound #1 Left Abdomen - Lower Quadrant: Clean wound with Normal Saline. Anesthetic: Wound #1 Left Abdomen - Lower Quadrant: Topical Lidocaine 4% cream applied to wound bed prior to debridement Primary Wound Dressing: Wound #1 Left Abdomen - Lower Quadrant: Prisma Ag Secondary Dressing: Wound #1 Left Abdomen - Lower Quadrant: Dry Gauze Dressing Change Frequency: Wound #1 Left Abdomen - Lower Quadrant: Change dressing every day. Follow-up Appointments: Wound #1 Left Abdomen - Lower Quadrant: Return Appointment in 2 weeks. NYNA, CHILTON ([id number]) We will continue application of Prisma AG and a small dressing under her colostomy wafer. Due to the holiday she will come back and see me in 2 weeks' time. Electronic Signature(s) Signed: 11/17/2015 3:29:26 PM By: Evlyn Kanner MD, FACS Entered By: Evlyn Kanner on 11/17/2015 15:29:25 Kristen Bake  ([id number]) -------------------------------------------------------------------------------- SuperBill Details Patient Name: Kristen Bake Date of Service: 11/17/2015 Medical Record Number: [id number] Patient Account Number: [id number] Date of Birth/Sex: 09/15/1926 (81 y.o. 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 K94.00 Colostomy complication, unspecified Facility Procedures CPT4 Code: [id number] Description: [id number] - WOUND CARE VISIT-LEV 2 EST PT Modifier: Quantity: 1 Physician Procedures CPT4 Code Description: [id number] - WC PHYS LEVEL 3 - EST PT ICD-10 Description Diagnosis E11.622 Type 2 diabetes mellitus with other skin ulcer 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 w K94.00 Colostomy complication, unspecified Modifier: ith unspecified Quantity: 1 severity Electronic Signature(s) Signed: 11/17/2015 3:29:55 PM By: Evlyn Kanner MD, FACS Entered By: Evlyn Kanner on 11/17/2015 15:29:55

## 2015-12-01 ENCOUNTER — Ambulatory Visit: Payer: PRIVATE HEALTH INSURANCE | Admitting: Surgery

## 2015-12-08 ENCOUNTER — Ambulatory Visit: Payer: PRIVATE HEALTH INSURANCE | Admitting: Surgery

## 2015-12-17 ENCOUNTER — Encounter: Payer: Medicare Other | Attending: Surgery | Admitting: Surgery

## 2015-12-17 DIAGNOSIS — M1990 Unspecified osteoarthritis, unspecified site: Secondary | ICD-10-CM | POA: Insufficient documentation

## 2015-12-17 DIAGNOSIS — K9400 Colostomy complication, unspecified: Secondary | ICD-10-CM | POA: Diagnosis not present

## 2015-12-17 DIAGNOSIS — E11622 Type 2 diabetes mellitus with other skin ulcer: Secondary | ICD-10-CM | POA: Insufficient documentation

## 2015-12-17 DIAGNOSIS — M8580 Other specified disorders of bone density and structure, unspecified site: Secondary | ICD-10-CM | POA: Insufficient documentation

## 2015-12-17 DIAGNOSIS — M329 Systemic lupus erythematosus, unspecified: Secondary | ICD-10-CM | POA: Insufficient documentation

## 2015-12-17 DIAGNOSIS — L98499 Non-pressure chronic ulcer of skin of other sites with unspecified severity: Secondary | ICD-10-CM | POA: Insufficient documentation

## 2015-12-17 DIAGNOSIS — Z933 Colostomy status: Secondary | ICD-10-CM | POA: Diagnosis not present

## 2015-12-18 NOTE — Progress Notes (Signed)
Kristen Wallace, Kristen Wallace ([id number]) Visit Report for 12/17/2015 Chief Complaint Document Details Patient Name: Kristen Wallace Date of Service: 12/17/2015 2:00 PM Medical Record Number: [id number] Patient Account Number: [id number] Date of Birth/Sex: 07-04-1926 (82 y.o. Female) Treating RN: Curtis Sites Primary Care Physician: Merri Brunette Other Clinician: Referring Physician: Merri Brunette Treating Physician/Extender: Rudene Re in Treatment: 8 Information Obtained from: Patient Chief Complaint Patient presents to the wound care center for a consult due non healing wound she had a nodule above the left lower quadrant colostomy for about 6 months which has now opened into an ulcer for the last week. Electronic Signature(s) Signed: 12/17/2015 2:32:29 PM By: Evlyn Kanner MD, FACS Entered By: Evlyn Kanner on 12/17/2015 14:32:29 Kristen Wallace ([id number]) -------------------------------------------------------------------------------- HPI Details Patient Name: Kristen Wallace Date of Service: 12/17/2015 2:00 PM Medical Record Number: [id number] Patient Account Number: [id number] Date of Birth/Sex: 21-Apr-1926 (82 y.o. Female) Treating RN: Curtis Sites Primary Care Physician: Merri Brunette Other Clinician: Referring Physician: Merri Brunette Treating Physician/Extender: Rudene Re in Treatment: 8 History of Present Illness Location: ulcerative area just above the colostomy opening in the left lower quadrant Quality: Patient reports No Pain. Severity: Patient states wound (s) are getting better. Duration: Patient has had the wound for > 6 months prior to seeking treatment at the wound center Context: The wound appeared gradually over time. it was a nodule initially and gradually broke down into an open ulcer. Modifying Factors: Other treatment(s) tried include: her PCP has put her on Augmentin recently Associated Signs and Symptoms: Wound has no purlulent drainage and her colostomy is  functioning fine HPI Description: Very pleasant 82 year old patient whose had a nodule just above the stoma and the left lower quadrant where she's had a colostomy for about 17-1/2 years. About a week ago the nodule opened into an ulcerated area and she was recently put on Augmentin for this. Colostomy is functioning fine and she has no issues with applying the bag. Her past medical history significant for diabetes mellitus type 2 SLE, OA, DDD, osteopenia, narcotic contract. Of note she is on chronic doses of prednisone which is 10 mg daily. She has never been a smoker. 10/27/2015 -- She had a culture from a wound which grew staph aureus which is MSSA. Her other labs sent and showed a WC count of 5.2 with hemoglobin of 12.2 hematocrit of 37.3 and platelets of 154. The patient also had her BMP done which showed slight elevation of her BUN/creatinine and her glucose was 167. Hemoglobin A1c was 5.6 Of note her supplies did not arrive and she has not been using Medihoney on her wound. Unfortunately they did not call us to let is know the lack of supplies. We will work with the vendor today to make certain that these reach her ASAP. 11/10/2015 -- she has been doing fine and has no new issues at the present time. Overall she has been doing her dressing as directed and finds this good improvement. 12/17/2015 -- we have not seen her for about a month because she had a upper respiratory infection and was not feeling up to it. She has been doing her dressing regularly though. Electronic Signature(s) Signed: 12/17/2015 2:32:59 PM By: Evlyn Kanner MD, FACS Entered By: Evlyn Kanner on 12/17/2015 14:32:58 Kristen Wallace, Kristen Wallace ([id number]) -------------------------------------------------------------------------------- Physical Exam Details Patient Name: Kristen Wallace Date of Service: 12/17/2015 2:00 PM Medical Record Number: [id number] Patient Account Number: [id number] Date of Birth/Sex: 11-22-1926 (82 y.o.  Female) Treating RN: Curtis Sites Primary Care Physician: [facility],  [facility] Other Clinician: Referring Physician: Merri Brunette Treating Physician/Extender: Rudene Re in Treatment: 8 Constitutional . Pulse regular. Respirations normal and unlabored. Afebrile. . Eyes Nonicteric. Reactive to light. Ears, Nose, Mouth, and Throat Lips, teeth, and gums WNL.Marland Kitchen Moist mucosa without lesions. Neck supple and nontender. No palpable supraclavicular or cervical adenopathy. Normal sized without goiter. Respiratory WNL. No retractions.. Cardiovascular Pedal Pulses WNL. No clubbing, cyanosis or edema. Lymphatic No adneopathy. No adenopathy. No adenopathy. Musculoskeletal Adexa without tenderness or enlargement.. Digits and nails w/o clubbing, cyanosis, infection, petechiae, ischemia, or inflammatory conditions.. Integumentary (Hair, Skin) No suspicious lesions. No crepitus or fluctuance. No peri-wound warmth or erythema. No masses.Marland Kitchen Psychiatric Judgement and insight Intact.. No evidence of depression, anxiety, or agitation.. Notes the wound at the 12:00 position of her colostomy is completely healed and there is no open ulceration Electronic Signature(s) Signed: 12/17/2015 2:33:24 PM By: Evlyn Kanner MD, FACS Entered By: Evlyn Kanner on 12/17/2015 14:33:23 Kristen Wallace ([id number]) -------------------------------------------------------------------------------- Physician Orders Details Patient Name: Kristen Wallace Date of Service: 12/17/2015 2:00 PM Medical Record Number: [id number] Patient Account Number: [id number] Date of Birth/Sex: 1926/01/04 (82 y.o. Female) Treating RN: Curtis Sites Primary Care Physician: Merri Brunette Other Clinician: Referring Physician: Merri Brunette Treating Physician/Extender: Rudene Re in Treatment: 8 Verbal / Phone Orders: Yes Clinician: Curtis Sites Read Back and Verified: Yes Diagnosis Coding Discharge From [facility] Services o  Discharge from Wound Care Center Electronic Signature(s) Signed: 12/17/2015 4:22:03 PM By: Evlyn Kanner MD, FACS Signed: 12/17/2015 5:48:27 PM By: Curtis Sites Entered By: Curtis Sites on 12/17/2015 14:26:27 Kristen Wallace ([id number]) -------------------------------------------------------------------------------- Problem List Details Patient Name: Kristen Wallace Date of Service: 12/17/2015 2:00 PM Medical Record Number: [id number] Patient Account Number: [id number] Date of Birth/Sex: 08-14-1926 (82 y.o. 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 Female) Treating RN: Curtis Sites Primary Care Physician: Merri Brunette Other Clinician: Referring Physician: Merri Brunette Treating Physician/Extender: Rudene Re in Treatment: 8 Subjective Chief Complaint Information obtained from Patient Patient presents to the wound care center for a consult due non healing wound she had a nodule above the left lower quadrant colostomy for about 6 months which has now opened into an ulcer  for the last week. History of Present Illness (HPI) The following HPI elements were documented for the patient's wound: Location: ulcerative area just above the colostomy opening in the left lower quadrant Quality: Patient reports No Pain. Severity: Patient states wound (s) are getting better. Duration: Patient has had the wound for > 6 months prior to seeking treatment at the wound center Context: The wound appeared gradually over time. it was a nodule initially and gradually broke down into an open ulcer. Modifying Factors: Other treatment(s) tried include: her PCP has put her on Augmentin recently Associated Signs and Symptoms: Wound has no purlulent drainage and her colostomy is functioning fine Very pleasant 81 year old patient whose  had a nodule just above the stoma and the left lower quadrant where she's had a colostomy for about 17-1/2 years. About a week ago the nodule opened into an ulcerated area and she was recently put on Augmentin for this. Colostomy is functioning fine and she has no issues with applying the bag. Her past medical history significant for diabetes mellitus type 2 SLE, OA, DDD, osteopenia, narcotic contract. Of note she is on chronic doses of prednisone which is 10 mg daily. She has never been a smoker. 10/27/2015 -- She had a culture from a wound which grew staph aureus which is MSSA. Her other labs sent and showed a WC count of 5.2 with hemoglobin of 12.2 hematocrit of 37.3 and platelets of 154. The patient also had her BMP done which showed slight elevation of her BUN/creatinine and her glucose was 167. Hemoglobin A1c was 5.6 Of note her supplies did not arrive and she has not been using Medihoney on her wound. Unfortunately they did not call us to let is know the lack of supplies. We will work with the vendor today to make certain that these reach her ASAP. 11/10/2015 -- she has been doing fine and has no new issues at the present time. Overall she has  been doing her dressing as directed and finds this good improvement. 12/17/2015 -- we have not seen her for about a month because she had a upper respiratory infection and was not feeling up to it. She has been doing her dressing regularly though. Kristen Wallace, Kristen Wallace ([id number]) Objective Constitutional Pulse regular. Respirations normal and unlabored. Afebrile. Vitals Time Taken: 2:07 PM, Height: 65 in, Weight: 165 lbs, BMI: 27.5, Temperature: 97.9 F, Pulse: 58 bpm, Respiratory Rate: 18 breaths/min, Blood Pressure: 158/54 mmHg. Eyes Nonicteric. Reactive to light. Ears, Nose, Mouth, and Throat Lips, teeth, and gums WNL.Marland Kitchen Moist mucosa without lesions. Neck supple and nontender. No palpable supraclavicular or cervical adenopathy. Normal sized without goiter. Respiratory WNL. No retractions.. Cardiovascular Pedal Pulses WNL. No clubbing, cyanosis or edema. Lymphatic No adneopathy. No adenopathy. No adenopathy. Musculoskeletal Adexa without tenderness or enlargement.. Digits and nails w/o clubbing, cyanosis, infection, petechiae, ischemia, or inflammatory conditions.Marland Kitchen Psychiatric Judgement and insight Intact.. No evidence of depression, anxiety, or agitation.. General Notes: the wound at the 12:00 position of her colostomy is completely healed and there is no open ulceration Integumentary (Hair, Skin) No suspicious lesions. No crepitus or fluctuance. No peri-wound warmth or erythema. No masses.. Wound #1 status is Open. Original cause of wound was Gradually Appeared. The wound is located on the Left Abdomen - Lower Quadrant. The wound measures 0cm length x 0cm width x 0cm depth; 0cm^2 area and 0cm^3 volume. Kristen Wallace, Kristen Wallace ([id number]) Assessment Active Problems ICD-10 E11.622 - Type 2 diabetes mellitus with other skin ulcer L98.499 - Non-pressure chronic ulcer of skin of other sites with unspecified severity K94.00 - Colostomy complication, unspecified Having healed the wound completely  she has been informed about her colostomy care and will continue protecting this area. She is discharged from the wound care services and will be seen back as needed Plan [facility]charge From [facility] Services: Discharge from Wound Care Center Having healed the wound completely she has been informed about her colostomy care and will continue protecting this area. She is discharged from the wound care services and will be seen back as needed Electronic Signature(s) Signed: 12/17/2015 2:34:18 PM By: Evlyn Kanner MD, FACS Entered By: Evlyn Kanner on 12/17/2015 14:34:17 Kristen Wallace ([id number]) -------------------------------------------------------------------------------- SuperBill Details Patient Name: Kristen Wallace,  Kristen Wallace Date of Service: 12/17/2015 Medical Record Number: [id number] Patient Account Number: [id number] Date of Birth/Sex: [birth year] (82 y.o. 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 K94.00 Colostomy complication, unspecified Facility Procedures CPT4 Code: [id number] Description: [phone] - WOUND CARE VISIT-LEV 2 EST PT Modifier: Quantity: 1 Physician Procedures CPT4 Code Description: [id number] - WC PHYS LEVEL 2 - EST PT ICD-10 Description Diagnosis E11.622 Type 2 diabetes mellitus with other skin ulcer 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

## 2015-12-18 NOTE — Progress Notes (Signed)
Kristen Wallace, Kristen Wallace ([id number]) Visit Report for 12/17/2015 Arrival Information Details Patient Name: Kristen Wallace Date of Service: 12/17/2015 2:00 PM Medical Record Number: [id number] Patient Account Number: [id number] Date of Birth/Sex: November 10, 1926 (82 y.o. Female) Treating RN: Curtis Sites Primary Care Physician: Merri Brunette Other Clinician: Referring Physician: Merri Brunette Treating Physician/Extender: Rudene Re in Treatment: 8 Visit Information History Since Last Visit Added or deleted any medications: No Patient Arrived: Walker Any new allergies or adverse reactions: No Arrival Time: 14:02 Had a fall or experienced change in No Accompanied By: dtr activities of daily living that may affect Transfer Assistance: None risk of falls: Patient Identification Verified: Yes Signs or symptoms of abuse/neglect since last No Secondary Verification Process Completed: Yes visito Patient Has Alerts: Yes Hospitalized since last visit: No Patient Alerts: DMII Pain Present Now: No Electronic Signature(s) Signed: 12/17/2015 5:48:27 PM By: Curtis Sites Entered By: Curtis Sites on 12/17/2015 14:03:32 Kristen Wallace ([id number]) -------------------------------------------------------------------------------- Clinic Level of Care Assessment Details Patient Name: Kristen Wallace Date of Service: 12/17/2015 2:00 PM Medical Record Number: [id number] Patient Account Number: [id number] Date of Birth/Sex: 03/26/1926 (82 y.o. Female) Treating RN: Curtis Sites Primary Care Physician: Merri Brunette Other Clinician: Referring Physician: Merri Brunette Treating Physician/Extender: Rudene Re in Treatment: 8 Clinic Level of Care Assessment Items TOOL 4 Quantity Score  - Use when only an EandM is performed on FOLLOW-UP visit 0 ASSESSMENTS - Nursing Assessment / Reassessment X - Reassessment of Co-morbidities (includes updates in patient status) 1 10 X - Reassessment of Adherence to  Treatment Plan 1 5 ASSESSMENTS - Wound and Skin Assessment / Reassessment X - Simple Wound Assessment / Reassessment - one wound 1 5  - Complex Wound Assessment / Reassessment - multiple wounds 0  - Dermatologic / Skin Assessment (not related to wound area) 0 ASSESSMENTS - Focused Assessment  - Circumferential Edema Measurements - multi extremities 0  - Nutritional Assessment / Counseling / Intervention 0  - Lower Extremity Assessment (monofilament, tuning fork, pulses) 0  - Peripheral Arterial Disease Assessment (using hand held doppler) 0 ASSESSMENTS - Ostomy and/or Continence Assessment and Care  - Incontinence Assessment and Management 0  - Ostomy Care Assessment and Management (repouching, etc.) 0 PROCESS - Coordination of Care X - Simple Patient / Family Education for ongoing care 1 15  - Complex (extensive) Patient / Family Education for ongoing care 0  - Staff obtains Chiropractor, Records, Test Results / Process Orders 0  - Staff telephones HHA, Nursing Homes / Clarify orders / etc 0  - Routine Transfer to another Facility (non-emergent condition) 0 Kristen Wallace, Kristen Wallace ([id number])  - Routine Hospital Admission (non-emergent condition) 0  - New Admissions / Manufacturing engineer / Ordering NPWT, Apligraf, etc. 0  - Emergency Hospital Admission (emergent condition) 0 X - Simple Discharge Coordination 1 10  - Complex (extensive) Discharge Coordination 0 PROCESS - Special Needs  - Pediatric / Minor Patient Management 0  - Isolation Patient Management 0  - Hearing / Language / Visual special needs 0  - Assessment of Community assistance (transportation, D/C planning, etc.) 0  - Additional assistance / Altered mentation 0  - Support Surface(s) Assessment (bed, cushion, seat, etc.) 0 INTERVENTIONS - Wound Cleansing / Measurement X - Simple Wound Cleansing - one wound 1 5  - Complex Wound Cleansing - multiple wounds 0 X - Wound Imaging  (photographs - any number of wounds) 1 5  - Wound Tracing (instead of photographs) 0 X - Simple Wound Measurement - one wound 1  5  - Complex Wound Measurement - multiple wounds 0 INTERVENTIONS - Wound Dressings  - Small Wound Dressing one or multiple wounds 0  - Medium Wound Dressing one or multiple wounds 0  - Large Wound Dressing one or multiple wounds 0  - Application of Medications - topical 0  - Application of Medications - injection 0 INTERVENTIONS - Miscellaneous  - External ear exam 0 Kristen Wallace, Kristen Wallace ([id number])  - Specimen Collection (cultures, biopsies, blood, body fluids, etc.) 0  - Specimen(s) / Culture(s) sent or taken to Lab for analysis 0  - Patient Transfer (multiple staff / Michiel Sites Lift / Similar devices) 0  - Simple Staple / Suture removal (25 or less) 0  - Complex Staple / Suture removal (26 or more) 0  - Hypo / Hyperglycemic Management (close monitor of Blood Glucose) 0  - Ankle / Brachial Index (ABI) - do not check if billed separately 0 X - Vital Signs 1 5 Has the patient been seen at the hospital within the last three years: Yes Total Score: 65 Level Of Care: New/Established - Level 2 Electronic Signature(s) Signed: 12/17/2015 5:48:27 PM By: Curtis Sites Entered By: Curtis Sites on 12/17/2015 14:26:52 Kristen Wallace ([id number]) -------------------------------------------------------------------------------- Encounter Discharge Information Details Patient Name: Kristen Wallace Date of Service: 12/17/2015 2:00 PM Medical Record Number: [id number] Patient Account Number: [id number] Date of Birth/Sex: [birth year] (82 y.o. Female) Treating RN: Curtis Sites Primary Care Physician: Merri Brunette Other Clinician: Referring Physician: Merri Brunette Treating Physician/Extender: Rudene Re in Treatment: 8 Encounter Discharge Information Items Discharge Pain Level: 0 Discharge Condition: Stable Ambulatory Status: Walker Discharge  Destination: Home Transportation: Private Auto Accompanied By: dtr Schedule Follow-up Appointment: Yes Medication Reconciliation completed and provided to Patient/Care No Sebastian Lurz: Provided on Clinical Summary of Care: 12/17/2015 Form Type Recipient Paper Patient [facility] Electronic Signature(s) Signed: 12/17/2015 2:41:31 PM By: Gwenlyn Perking Entered By: Gwenlyn Perking on 12/17/2015 14:41:31 Kristen Wallace ([id number]) -------------------------------------------------------------------------------- Multi-Disciplinary Care Plan Details Patient Name: Kristen Wallace Date of Service: 12/17/2015 2:00 PM Medical Record Number: [id number] Patient Account Number: [id number] Date of Birth/Sex: May 30, 1926 (82 y.o. Female) Treating RN: Curtis Sites Primary Care Physician: Merri Brunette Other Clinician: Referring Physician: Merri Brunette Treating Physician/Extender: Rudene Re in Treatment: 8 Active Inactive Electronic Signature(s) Signed: 12/17/2015 5:48:27 PM By: Curtis Sites Entered By: Curtis Sites on 12/17/2015 14:26:13 Kristen Wallace ([id number]) -------------------------------------------------------------------------------- Patient/Caregiver Education Details Patient Name: Kristen Wallace Date of Service: 12/17/2015 2:00 PM Medical Record Number: [id number] Patient Account Number: [id number] Date of Birth/Gender: Mar 29, 1926 (82 y.o. Female) Treating RN: Curtis Sites Primary Care Physician: Merri Brunette Other Clinician: Referring Physician: Merri Brunette Treating Physician/Extender: Rudene Re in Treatment: 8 Education Assessment Education Provided To: Patient and Caregiver Education Topics Provided Basic Hygiene: Handouts: Other: care of newly healed ulcer site with ostomy care as well Methods: Explain/Verbal Responses: State content correctly Electronic Signature(s) Signed: 12/17/2015 5:48:27 PM By: Curtis Sites Entered By: Curtis Sites on 12/17/2015  14:27:43 Kristen Wallace ([id number]) -------------------------------------------------------------------------------- Wound Assessment Details Patient Name: Kristen Wallace Date of Service: 12/17/2015 2:00 PM Medical Record Number: [id number] Patient Account Number: [id number] Date of Birth/Sex: Oct 27, 1926 (82 y.o. Female) Treating RN: Curtis Sites Primary Care Physician: Merri Brunette Other Clinician: Referring Physician: Merri Brunette Treating Physician/Extender: Rudene Re in Treatment: 8 Wound Status Wound Number: 1 Primary Infection - not elsewhere Etiology: classified Wound Location: Left Abdomen - Lower Quadrant Wound Status: Open Wounding Event: Gradually Appeared Date Acquired: 10/09/2015 Weeks Of Treatment: 8 Clustered Wound: No Photos Photo Uploaded By: Francesco Sor,  Joanna on 12/17/2015 16:19:23 Wound Measurements Length: (cm) 0 % Reducti Width: (cm) 0 % Reducti Depth: (cm) 0 Area: (cm) 0 Volume: (cm) 0 on in Area: 100% on in Volume: 100% Wound Description Full Thickness Without Exposed Classification: Support Structures Periwound Skin Texture Texture Color No Abnormalities Noted: No No Abnormalities Noted: No Moisture No Abnormalities Noted: No Electronic ALEESHA, Kristen Wallace ([id number]) Signed: 12/17/2015 5:48:27 PM By: Curtis Sites Entered By: Curtis Sites on 12/17/2015 14:12:24 Kristen Wallace ([id number]) -------------------------------------------------------------------------------- Vitals Details Patient Name: Kristen Wallace Date of Service: 12/17/2015 2:00 PM Medical Record Number: [id number] Patient Account Number: [id number] Date of Birth/Sex: 05-06-1926 (82 y.o. Female) Treating RN: Curtis Sites Primary Care Physician: Merri Brunette Other Clinician: Referring Physician: Merri Brunette Treating Physician/Extender: Rudene Re in Treatment: 8 Vital Signs Time Taken: 14:07 Temperature (F): 97.9 Height (in): 65 Pulse (bpm):  58 Weight (lbs): 165 Respiratory Rate (breaths/min): 18 Body Mass Index (BMI): 27.5 Blood Pressure (mmHg): 158/54 Reference Range: 80 - 120 mg / dl Electronic Signature(s) Signed: 12/17/2015 5:48:27 PM By: Curtis Sites Entered By: Curtis Sites on 12/17/2015 14:07:30

## 2016-01-28 ENCOUNTER — Other Ambulatory Visit: Payer: Self-pay | Admitting: Pain Medicine

## 2016-01-28 DIAGNOSIS — M542 Cervicalgia: Secondary | ICD-10-CM

## 2016-01-29 ENCOUNTER — Other Ambulatory Visit: Payer: Self-pay | Admitting: Pain Medicine

## 2016-01-29 DIAGNOSIS — M542 Cervicalgia: Secondary | ICD-10-CM

## 2016-02-05 ENCOUNTER — Ambulatory Visit
Admission: RE | Admit: 2016-02-05 | Discharge: 2016-02-05 | Disposition: A | Discharge status: Home / Self Care | Payer: Medicare Other | Source: Ambulatory Visit | Attending: Pain Medicine | Admitting: Pain Medicine

## 2016-02-05 DIAGNOSIS — M542 Cervicalgia: Secondary | ICD-10-CM

## 2016-02-09 ENCOUNTER — Other Ambulatory Visit: Payer: Self-pay | Admitting: Pain Medicine

## 2016-02-09 DIAGNOSIS — M25512 Pain in left shoulder: Secondary | ICD-10-CM

## 2016-02-16 ENCOUNTER — Other Ambulatory Visit: Payer: Self-pay | Admitting: Pain Medicine

## 2016-02-16 DIAGNOSIS — M545 Low back pain: Secondary | ICD-10-CM

## 2016-02-16 DIAGNOSIS — M25512 Pain in left shoulder: Secondary | ICD-10-CM

## 2016-02-18 ENCOUNTER — Ambulatory Visit
Admission: RE | Admit: 2016-02-18 | Discharge: 2016-02-18 | Disposition: A | Discharge status: Home / Self Care | Payer: Medicare Other | Source: Ambulatory Visit | Attending: Pain Medicine | Admitting: Pain Medicine

## 2016-02-18 DIAGNOSIS — M25512 Pain in left shoulder: Secondary | ICD-10-CM

## 2016-03-01 ENCOUNTER — Ambulatory Visit
Admission: RE | Admit: 2016-03-01 | Discharge: 2016-03-01 | Disposition: A | Discharge status: Home / Self Care | Payer: Medicare Other | Source: Ambulatory Visit | Attending: Pain Medicine | Admitting: Pain Medicine

## 2016-03-01 DIAGNOSIS — M545 Low back pain: Secondary | ICD-10-CM

## 2016-03-01 DIAGNOSIS — M25512 Pain in left shoulder: Secondary | ICD-10-CM

## 2016-03-02 ENCOUNTER — Inpatient Hospital Stay (HOSPITAL_COMMUNITY)
Admission: EM | Admit: 2016-03-02 | Discharge: 2016-03-07 | DRG: 517 | Disposition: A | Discharge status: Home Health | Payer: Medicare Other | Attending: Neurosurgery | Admitting: Neurosurgery

## 2016-03-02 ENCOUNTER — Encounter (HOSPITAL_COMMUNITY): Payer: Self-pay

## 2016-03-02 DIAGNOSIS — S32039A Unspecified fracture of third lumbar vertebra, initial encounter for closed fracture: Secondary | ICD-10-CM | POA: Diagnosis present

## 2016-03-02 DIAGNOSIS — I129 Hypertensive chronic kidney disease with stage 1 through stage 4 chronic kidney disease, or unspecified chronic kidney disease: Secondary | ICD-10-CM | POA: Diagnosis present

## 2016-03-02 DIAGNOSIS — Z7952 Long term (current) use of systemic steroids: Secondary | ICD-10-CM | POA: Diagnosis not present

## 2016-03-02 DIAGNOSIS — G8929 Other chronic pain: Secondary | ICD-10-CM | POA: Diagnosis present

## 2016-03-02 DIAGNOSIS — N189 Chronic kidney disease, unspecified: Secondary | ICD-10-CM | POA: Diagnosis present

## 2016-03-02 DIAGNOSIS — M329 Systemic lupus erythematosus, unspecified: Secondary | ICD-10-CM | POA: Diagnosis present

## 2016-03-02 DIAGNOSIS — W07XXXA Fall from chair, initial encounter: Secondary | ICD-10-CM | POA: Diagnosis present

## 2016-03-02 DIAGNOSIS — G834 Cauda equina syndrome: Secondary | ICD-10-CM

## 2016-03-02 DIAGNOSIS — S32030A Wedge compression fracture of third lumbar vertebra, initial encounter for closed fracture: Secondary | ICD-10-CM | POA: Diagnosis present

## 2016-03-02 DIAGNOSIS — E1122 Type 2 diabetes mellitus with diabetic chronic kidney disease: Secondary | ICD-10-CM | POA: Diagnosis present

## 2016-03-02 DIAGNOSIS — M4806 Spinal stenosis, lumbar region: Secondary | ICD-10-CM | POA: Diagnosis present

## 2016-03-02 DIAGNOSIS — M549 Dorsalgia, unspecified: Secondary | ICD-10-CM | POA: Diagnosis present

## 2016-03-02 DIAGNOSIS — H3530 Unspecified macular degeneration: Secondary | ICD-10-CM | POA: Diagnosis present

## 2016-03-02 LAB — CBC WITH DIFFERENTIAL/PLATELET
Basophils Absolute: 0 10*3/uL (ref 0.0–0.1)
Basophils Relative: 0 %
Eosinophils Absolute: 0 10*3/uL (ref 0.0–0.7)
Eosinophils Relative: 0 %
HEMATOCRIT: 33.3 % — AB (ref 36.0–46.0)
HEMOGLOBIN: 11.7 g/dL — AB (ref 12.0–15.0)
LYMPHS PCT: 10 %
Lymphs Abs: 0.7 10*3/uL (ref 0.7–4.0)
MCH: 33.9 pg (ref 26.0–34.0)
MCHC: 35.1 g/dL (ref 30.0–36.0)
MCV: 96.5 fL (ref 78.0–100.0)
MONO ABS: 0.6 10*3/uL (ref 0.1–1.0)
MONOS PCT: 9 %
NEUTROS ABS: 6 10*3/uL (ref 1.7–7.7)
NEUTROS PCT: 81 %
Platelets: 223 10*3/uL (ref 150–400)
RBC: 3.45 MIL/uL — ABNORMAL LOW (ref 3.87–5.11)
RDW: 12 % (ref 11.5–15.5)
WBC: 7.3 10*3/uL (ref 4.0–10.5)

## 2016-03-02 LAB — URINALYSIS, ROUTINE W REFLEX MICROSCOPIC
BILIRUBIN URINE: NEGATIVE
Glucose, UA: NEGATIVE mg/dL
HGB URINE DIPSTICK: NEGATIVE
Ketones, ur: NEGATIVE mg/dL
NITRITE: NEGATIVE
PH: 6 (ref 5.0–8.0)
Protein, ur: NEGATIVE mg/dL
SPECIFIC GRAVITY, URINE: 1.013 (ref 1.005–1.030)

## 2016-03-02 LAB — COMPREHENSIVE METABOLIC PANEL
ALBUMIN: 3.8 g/dL (ref 3.5–5.0)
ALK PHOS: 69 U/L (ref 38–126)
ALT: 20 U/L (ref 14–54)
AST: 29 U/L (ref 15–41)
Anion gap: 8 (ref 5–15)
BILIRUBIN TOTAL: 0.8 mg/dL (ref 0.3–1.2)
BUN: 28 mg/dL — AB (ref 6–20)
CALCIUM: 9.2 mg/dL (ref 8.9–10.3)
CO2: 28 mmol/L (ref 22–32)
Chloride: 95 mmol/L — ABNORMAL LOW (ref 101–111)
Creatinine, Ser: 1.1 mg/dL — ABNORMAL HIGH (ref 0.44–1.00)
GFR calc Af Amer: 51 mL/min — ABNORMAL LOW (ref >60)
GFR calc non Af Amer: 44 mL/min — ABNORMAL LOW (ref >60)
GLUCOSE: 176 mg/dL — AB (ref 65–99)
Potassium: 3.5 mmol/L (ref 3.5–5.1)
Sodium: 131 mmol/L — ABNORMAL LOW (ref 135–145)
TOTAL PROTEIN: 6.2 g/dL — AB (ref 6.5–8.1)

## 2016-03-02 LAB — URINE MICROSCOPIC-ADD ON

## 2016-03-02 LAB — PROTIME-INR
INR: 1.01 (ref 0.00–1.49)
Prothrombin Time: 13.1 seconds (ref 11.6–15.2)

## 2016-03-02 MED ORDER — SODIUM CHLORIDE 0.9 % IV SOLN
250.0000 mL | INTRAVENOUS | Status: DC | PRN
  Administered 2016-03-03: 250 mL via INTRAVENOUS

## 2016-03-02 MED ORDER — IRBESARTAN 150 MG PO TABS
150.0000 mg | ORAL_TABLET | Freq: Every day | ORAL | Status: DC
Start: 2016-03-03 — End: 2016-03-08
  Administered 2016-03-04 – 2016-03-07 (×4): 150 mg via ORAL
  Filled 2016-03-02 (×5): qty 1

## 2016-03-02 MED ORDER — SENNOSIDES-DOCUSATE SODIUM 8.6-50 MG PO TABS: 1.0000 | ORAL_TABLET | Freq: Every evening | ORAL | Status: DC | PRN

## 2016-03-02 MED ORDER — ZOLPIDEM TARTRATE 5 MG PO TABS: 5.0000 mg | ORAL_TABLET | Freq: Every evening | ORAL | Status: DC | PRN

## 2016-03-02 MED ORDER — PANTOPRAZOLE SODIUM 40 MG PO TBEC
40.0000 mg | DELAYED_RELEASE_TABLET | Freq: Every day | ORAL | Status: DC
  Administered 2016-03-03 – 2016-03-07 (×5): 40 mg via ORAL
  Filled 2016-03-02 (×5): qty 1

## 2016-03-02 MED ORDER — HYDROCHLOROTHIAZIDE 25 MG PO TABS
12.5000 mg | ORAL_TABLET | ORAL | Status: DC
  Administered 2016-03-04 – 2016-03-06 (×2): 12.5 mg via ORAL
  Filled 2016-03-02 (×2): qty 1

## 2016-03-02 MED ORDER — HYDROMORPHONE HCL 1 MG/ML IJ SOLN
1.0000 mg | INTRAMUSCULAR | Status: DC | PRN
Start: 2016-03-02 — End: 2016-03-06
  Administered 2016-03-02 – 2016-03-06 (×8): 1 mg via INTRAVENOUS
  Filled 2016-03-02 (×8): qty 1

## 2016-03-02 MED ORDER — ACETAMINOPHEN 325 MG PO TABS
650.0000 mg | ORAL_TABLET | Freq: Four times a day (QID) | ORAL | Status: DC | PRN
Start: 2016-03-02 — End: 2016-03-08

## 2016-03-02 MED ORDER — PIOGLITAZONE HCL 15 MG PO TABS
15.0000 mg | ORAL_TABLET | Freq: Every day | ORAL | Status: DC
  Administered 2016-03-03 – 2016-03-07 (×5): 15 mg via ORAL
  Filled 2016-03-02 (×5): qty 1

## 2016-03-02 MED ORDER — BISACODYL 5 MG PO TBEC
5.0000 mg | DELAYED_RELEASE_TABLET | Freq: Every day | ORAL | Status: DC | PRN
Start: 2016-03-02 — End: 2016-03-08
  Filled 2016-03-02: qty 1

## 2016-03-02 MED ORDER — ONDANSETRON HCL 4 MG/2ML IJ SOLN
4.0000 mg | Freq: Four times a day (QID) | INTRAMUSCULAR | Status: DC | PRN
Start: 2016-03-02 — End: 2016-03-08
  Administered 2016-03-02: 4 mg via INTRAVENOUS
  Filled 2016-03-02: qty 2

## 2016-03-02 MED ORDER — SODIUM CHLORIDE 0.9% FLUSH: 3.0000 mL | INTRAVENOUS | Status: DC | PRN

## 2016-03-02 MED ORDER — ACETAMINOPHEN 650 MG RE SUPP: 650.0000 mg | Freq: Four times a day (QID) | RECTAL | Status: DC | PRN

## 2016-03-02 MED ORDER — PANTOPRAZOLE SODIUM 40 MG PO TBEC: 40.0000 mg | DELAYED_RELEASE_TABLET | Freq: Every day | ORAL | Status: DC

## 2016-03-02 MED ORDER — ATENOLOL 25 MG PO TABS
12.5000 mg | ORAL_TABLET | Freq: Every day | ORAL | Status: DC
  Administered 2016-03-03 – 2016-03-07 (×5): 12.5 mg via ORAL
  Filled 2016-03-02 (×5): qty 1

## 2016-03-02 MED ORDER — DARIFENACIN HYDROBROMIDE ER 7.5 MG PO TB24
15.0000 mg | ORAL_TABLET | Freq: Every day | ORAL | Status: DC
  Filled 2016-03-02 (×3): qty 2
  Filled 2016-03-02: qty 1
  Filled 2016-03-02: qty 2

## 2016-03-02 MED ORDER — ARTIFICIAL TEARS OP OINT
TOPICAL_OINTMENT | Freq: Three times a day (TID) | OPHTHALMIC | Status: DC | PRN
  Filled 2016-03-02: qty 3.5

## 2016-03-02 MED ORDER — SODIUM CHLORIDE 0.9% FLUSH
3.0000 mL | Freq: Two times a day (BID) | INTRAVENOUS | Status: DC
  Administered 2016-03-02 – 2016-03-07 (×8): 3 mL via INTRAVENOUS

## 2016-03-02 MED ORDER — SENNA 8.6 MG PO TABS
1.0000 | ORAL_TABLET | Freq: Two times a day (BID) | ORAL | Status: DC
  Administered 2016-03-02 – 2016-03-07 (×10): 8.6 mg via ORAL
  Filled 2016-03-02 (×10): qty 1

## 2016-03-02 MED ORDER — OXYCODONE HCL 5 MG PO TABS
5.0000 mg | ORAL_TABLET | ORAL | Status: DC | PRN
  Administered 2016-03-02 – 2016-03-03 (×2): 10 mg via ORAL
  Filled 2016-03-02 (×2): qty 2

## 2016-03-02 MED ORDER — ADULT MULTIVITAMIN W/MINERALS CH
1.0000 | ORAL_TABLET | Freq: Every day | ORAL | Status: DC
Start: 2016-03-03 — End: 2016-03-08
  Administered 2016-03-03 – 2016-03-07 (×5): 1 via ORAL
  Filled 2016-03-02 (×5): qty 1

## 2016-03-02 MED ORDER — POTASSIUM CHLORIDE IN NACL 20-0.9 MEQ/L-% IV SOLN
INTRAVENOUS | Status: DC
  Administered 2016-03-02 – 2016-03-03 (×3): via INTRAVENOUS
  Filled 2016-03-02 (×12): qty 1000

## 2016-03-02 MED ORDER — PREDNISONE 5 MG PO TABS
7.5000 mg | ORAL_TABLET | Freq: Every day | ORAL | Status: DC
  Administered 2016-03-03 – 2016-03-07 (×5): 7.5 mg via ORAL
  Filled 2016-03-02: qty 1
  Filled 2016-03-02 (×4): qty 2

## 2016-03-02 MED ORDER — MAGNESIUM CITRATE PO SOLN: 1.0000 | Freq: Once | ORAL | Status: DC | PRN

## 2016-03-02 MED ORDER — ONDANSETRON HCL 4 MG PO TABS: 4.0000 mg | ORAL_TABLET | Freq: Four times a day (QID) | ORAL | Status: DC | PRN

## 2016-03-02 MED ORDER — CITALOPRAM HYDROBROMIDE 10 MG PO TABS
10.0000 mg | ORAL_TABLET | Freq: Every morning | ORAL | Status: DC
  Filled 2016-03-02 (×5): qty 1

## 2016-03-02 NOTE — ED Provider Notes (Signed)
CSN: [id number]     Arrival date & time 03/02/16  1541 History   First MD Initiated Contact with Patient 03/02/16 1625     Chief Complaint  Patient presents with  . Back Pain     (Consider location/radiation/quality/duration/timing/severity/associated sxs/prior Treatment) Patient is a 82 y.o. female presenting with back pain.  Back Pain Location:  Lumbar spine Quality:  Stabbing Radiates to:  L posterior upper leg and R posterior upper leg Pain severity:  Moderate Duration:  3 days Timing:  Constant Progression:  Worsening Chronicity:  New Ineffective treatments:  Narcotics Associated symptoms: numbness (saddle)   Associated symptoms: no dysuria     Past Medical History  Diagnosis Date  . Back pain, chronic   . Spinal stenosis   . Blood transfusion without reported diagnosis   . Hypertension   . Diabetes mellitus without complication (HCC)   . Macular degeneration   . Lupus (HCC)   . Chronic renal insufficiency    Past Surgical History  Procedure Laterality Date  . Colostomy    . Tonsillectomy    . Appendectomy    . Cholecystectomy    . Eye surgery     No family history on file. Social History  Substance Use Topics  . Smoking status: Never Smoker   . Smokeless tobacco: Never Used  . Alcohol Use: No   OB History    No data available     Review of Systems  Eyes: Negative for pain and redness.  Endocrine: Negative for polydipsia and polyuria.  Genitourinary: Positive for difficulty urinating. Negative for dysuria.  Musculoskeletal: Positive for back pain and gait problem.  Neurological: Positive for numbness (saddle). Negative for syncope.  All other systems reviewed and are negative.     Allergies  Betadine  Home Medications   Prior to Admission medications   Medication Sig Start Date End Date Taking? Authorizing Provider  atenolol (TENORMIN) 25 MG tablet Take 12.5 mg by mouth daily.   Yes Historical Provider, MD  darifenacin (ENABLEX) 15 MG 24  hr tablet Take 15 mg by mouth daily.   Yes Historical Provider, MD  hydrochlorothiazide (HYDRODIURIL) 25 MG tablet Take 12.5 mg by mouth every other day.   Yes Historical Provider, MD  Hypromellose (ARTIFICIAL TEARS OP) Place 1 drop into both eyes 3 (three) times daily as needed (dry eyes).   Yes Historical Provider, MD  lidocaine (LIDODERM) 5 % APPLY 1 PATCH TO INTACT SKIN REMOVE AFTER 12 HOURS ONCE A DAY EXTERNALLY 02/10/16  Yes Historical Provider, MD  Multiple Vitamin (MULTIVITAMIN) capsule Take 1 capsule by mouth daily.   Yes Historical Provider, MD  Multiple Vitamins-Minerals (ICAPS PO) Take 1 capsule by mouth 2 (two) times daily.   Yes Historical Provider, MD  omeprazole (PRILOSEC) 20 MG capsule Take 20 mg by mouth daily.   Yes Historical Provider, MD  oxyCODONE-acetaminophen (PERCOCET) 10-325 MG tablet Take 1 tablet by mouth every 4 (four) hours as needed for pain.  02/15/16  Yes Historical Provider, MD  pioglitazone (ACTOS) 30 MG tablet Take 15 mg by mouth daily.    Yes Historical Provider, MD  predniSONE (DELTASONE) 5 MG tablet Take 7.5 mg by mouth daily. 02/12/16  Yes Historical Provider, MD  Probiotic Product (PROBIOTIC PO) Take 1 capsule by mouth daily.   Yes Historical Provider, MD  telmisartan (MICARDIS) 40 MG tablet Take 40 mg by mouth daily.   Yes Historical Provider, MD  citalopram (CELEXA) 10 MG tablet Take 10 mg by mouth every morning.  02/15/16   Historical Provider, MD  levofloxacin (LEVAQUIN) 500 MG tablet Take 1 tablet (500 mg total) by mouth daily. Patient not taking: Reported on 03/02/2016 02/07/13   Vassie Loll, MD   BP 134/54 mmHg  Pulse 87  Temp(Src) 99 F (37.2 C) (Oral)  Resp 16  Ht  (1.651 m)  Wt 149 lb 9.6 oz (67.858 kg)  BMI 24.89 kg/m2  SpO2 95% Physical Exam  Constitutional: She is oriented to person, place, and time. She appears well-developed and well-nourished.  HENT:  Head: Normocephalic and atraumatic.  Neck: Normal range of motion.   Cardiovascular: Normal rate and regular rhythm.   Pulmonary/Chest: No stridor. No respiratory distress.  Abdominal: She exhibits no distension.  Musculoskeletal: She exhibits no tenderness.  Neurological: She is alert and oriented to person, place, and time. No cranial nerve deficit.  0 sensation to pain in saddle distribution 1+ patellar reflexes 4+/5 strength in dorsi/plantar flexion  Nursing note and vitals reviewed.   ED Course  Procedures (including critical care time)  CRITICAL CARE Performed by: Marily Memos   Total critical care time: 35 minutes  Critical care time was exclusive of separately billable procedures and treating other patients.  Critical care was necessary to treat or prevent imminent or life-threatening deterioration.  Critical care was time spent personally by me on the following activities: development of treatment plan with patient and/or surrogate as well as nursing, discussions with consultants, evaluation of patient's response to treatment, examination of patient, obtaining history from patient or surrogate, ordering and performing treatments and interventions, ordering and review of laboratory studies, ordering and review of radiographic studies, pulse oximetry and re-evaluation of patient's condition.   Labs Review Labs Reviewed  CBC WITH DIFFERENTIAL/PLATELET - Abnormal; Notable for the following:    RBC 3.45 (*)    Hemoglobin 11.7 (*)    HCT 33.3 (*)    All other components within normal limits  COMPREHENSIVE METABOLIC PANEL - Abnormal; Notable for the following:    Sodium 131 (*)    Chloride 95 (*)    Glucose, Bld 176 (*)    BUN 28 (*)    Creatinine, Ser 1.10 (*)    Total Protein 6.2 (*)    GFR calc non Af Amer 44 (*)    GFR calc Af Amer 51 (*)    All other components within normal limits  URINALYSIS, ROUTINE W REFLEX MICROSCOPIC (NOT AT [facility]) - Abnormal; Notable for the following:    Leukocytes, UA LARGE (*)    All other components  within normal limits  URINE MICROSCOPIC-ADD ON - Abnormal; Notable for the following:    Squamous Epithelial / LPF 6-30 (*)    Bacteria, UA RARE (*)    All other components within normal limits  URINE CULTURE  PROTIME-INR    Imaging Review Mr Lumbar Spine Wo Contrast  03/01/2016  CLINICAL DATA:  Chronic low back pain. Symptoms began 2 weeks ago. No reported injury. EXAM: MRI LUMBAR SPINE WITHOUT CONTRAST TECHNIQUE: Multiplanar, multisequence MR imaging of the lumbar spine was performed. No intravenous contrast was administered. COMPARISON:  MRI lumbar spine 02/04/2013. FINDINGS: Segmentation: Normal. Alignment: There is slight retrolisthesis L2 on L3, estimated 3 mm. This is associated with the retropulsed bone fragment into the canal described below. 4 mm anterolisthesis L4-5. Vertebrae: There is significant (up to 50%) loss of height at L3 associated with a T2 and STIR hyperintense fluid collection throughout the superior endplate, extending into the disc space. The appearance is  that of an acute or subacute L3 compression fracture, with osteonecrosis and fluid dissecting retrograde into L2-L3. Infection is possible but less favored. Conus medullaris: Normal in size, signal, and location. Paraspinal tissues: No evidence for hydronephrosis or paravertebral mass. Disc levels: L1-L2: Mild bulge. Moderate posterior element hypertrophy. Mild stenosis. Either L2 nerve root could be affected. L2-L3: 4-6 mm retropulsed fragment superior posterior aspect of L3, greater on the LEFT. Critical spinal stenosis. Superimposed posterior element hypertrophy with associated disc protrusion. LEFT greater than RIGHT L3 nerve root impingement. Serpiginous nerve roots are seen opposite L2 in the canal, implying severe stenosis. BILATERAL foraminal narrowing, slightly worse on the LEFT, could affect the L2 nerve roots. L3-L4: Central and leftward extrusion. Extraforaminal component extends more to the RIGHT. Posterior element  hypertrophy. Severe stenosis. BILATERAL L4 nerve root in the canal. RIGHT L3 nerve root impingement in the foramen and beyond. L4-L5: Central disc extrusion. 4 mm anterolisthesis. Advanced posterior element hypertrophy. Severe spinal stenosis. LEFT greater than RIGHT L5 nerve root impingement. BILATERAL foraminal narrowing possibly affects the L4 nerve roots. L5-S1: Central protrusion. Posterior element hypertrophy. Mild stenosis. No definite S1 or L5 nerve root impingement. IMPRESSION: Suspected acute or subacute compression fracture of L3, superior endplate, up to 50%, with osteonecrotic cleft extending into the L2-3 interspace. Retropulsed fragment at this level results in critical stenosis. Infection is less favored. If there are signs and symptoms concerning for discitis or osteomyelitis, post infusion imaging could be considered. Severe multifactorial spinal stenosis is also present at L4-5 and L3-4, contributing to potentially symptomatic neural impingement at those levels. See discussion above. These results will be called to the ordering clinician or representative by the Radiologist Assistant, and communication documented in the PACS or zVision Dashboard. Electronically Signed   By: Elsie Stain M.D.   On: 03/01/2016 16:34   I have personally reviewed and evaluated these images and lab results as part of my medical decision-making.   EKG Interpretation   Date/Time:  Wednesday March 02 2016 17:05:57 EDT Ventricular Rate:  90 PR Interval:  190 QRS Duration: 146 QT Interval:  377 QTC Calculation: 461 R Axis:   -50 Text Interpretation:  Sinus rhythm Atrial premature complexes LVH with  IVCD, LAD and secondary repol abnrm Baseline wander in lead(s) V1  Confirmed by [facility] MD, Barbara Cower [phone]) on 03/02/2016 5:50:58 PM      MDM   Final diagnoses:  Compression fracture of L3 lumbar vertebra, closed, initial encounter (HCC)  Cauda equina compression (HCC)   Concern for cauda equina, NSG  consulted.   NSG requests rectal exam, rectal exam with significantly decreased to no tone.   NSG to admit. Transfer to [facility].     Marily Memos, MD 03/02/16 [phone]

## 2016-03-02 NOTE — ED Notes (Signed)
She c/o chronic back pain which recently has gotten worse.  She also states she has some chronic (mild) weakness of left leg.  She also c/o having some weakness of her right leg x 4 days (which is new).  She also states she has some degree of bilat. Diabetic neuropathy of bilat. L.e.  She states she sees a pain clinic physician; and has had "injections in my back" in the past.  She also tells me she had m.r.i. Of spine yesterday.

## 2016-03-02 NOTE — ED Notes (Signed)
Patient being picked up by [facility] and [facility] to [facility]

## 2016-03-02 NOTE — H&P (Signed)
Kristen Wallace is an 82 y.o. female.   Chief Complaint: back pain, L3 fracture HPI: whom has dealt with chronic low back pain for years. She has had this treated conservatively, and successfully. Aprroximately 5 weeks ago she fell while sitting down, the chair moved and she landed on her bottom. The past 2-3 days, she complained of severe pain in the lower back, and weakness in her lower extremities. She has had problems initiating urination, and problems with bowel movements. This led her to come to [facility] for care today. An MRI of the lumbar spine was performed yesterday showing a compression fracture of L3, severe stenosis at L2/3, 3/4, and 4/5.   Past Medical History  Diagnosis Date  . Back pain, chronic   . Spinal stenosis   . Blood transfusion without reported diagnosis   . Hypertension   . Diabetes mellitus without complication ([place])   . Macular degeneration   . Lupus ([place])   . Chronic renal insufficiency     Past Surgical History  Procedure Laterality Date  . Colostomy    . Tonsillectomy    . Appendectomy    . Cholecystectomy    . Eye surgery      No family history on file. Social History:  reports that she has never smoked. She has never used smokeless tobacco. She reports that she does not drink alcohol or use illicit drugs.  Allergies:  Allergies  Allergen Reactions  . Betadine [Povidone Iodine] Itching and Other (See Comments)    Burns eyes     (Not in a hospital admission)  Results for orders placed or performed during the hospital encounter of 03/02/16 (from the past 48 hour(s))  CBC with Differential     Status: Abnormal   Collection Time: 03/02/16  5:09 PM  Result Value Ref Range   WBC 7.3 4.0 - 10.5 K/uL   RBC 3.45 (L) 3.87 - 5.11 MIL/uL   Hemoglobin 11.7 (L) 12.0 - 15.0 g/dL   HCT 33.3 (L) 36.0 - 46.0 %   MCV 96.5 78.0 - 100.0 fL   MCH 33.9 26.0 - 34.0 pg   MCHC 35.1 30.0 - 36.0 g/dL   RDW 12.0 11.5 - 15.5 %   Platelets 223 150 - 400 K/uL    Neutrophils Relative % 81 %   Neutro Abs 6.0 1.7 - 7.7 K/uL   Lymphocytes Relative 10 %   Lymphs Abs 0.7 0.7 - 4.0 K/uL   Monocytes Relative 9 %   Monocytes Absolute 0.6 0.1 - 1.0 K/uL   Eosinophils Relative 0 %   Eosinophils Absolute 0.0 0.0 - 0.7 K/uL   Basophils Relative 0 %   Basophils Absolute 0.0 0.0 - 0.1 K/uL  Comprehensive metabolic panel     Status: Abnormal   Collection Time: 03/02/16  5:09 PM  Result Value Ref Range   Sodium 131 (L) 135 - 145 mmol/L   Potassium 3.5 3.5 - 5.1 mmol/L   Chloride 95 (L) 101 - 111 mmol/L   CO2 28 22 - 32 mmol/L   Glucose, Bld 176 (H) 65 - 99 mg/dL   BUN 28 (H) 6 - 20 mg/dL   Creatinine, Ser 1.10 (H) 0.44 - 1.00 mg/dL   Calcium 9.2 8.9 - 10.3 mg/dL   Total Protein 6.2 (L) 6.5 - 8.1 g/dL   Albumin 3.8 3.5 - 5.0 g/dL   AST 29 15 - 41 U/L   ALT 20 14 - 54 U/L   Alkaline Phosphatase 69 38 -  126 U/L   Total Bilirubin 0.8 0.3 - 1.2 mg/dL   GFR calc non Af Amer 44 (L) >60 mL/min   GFR calc Af Amer 51 (L) >60 mL/min    Comment: (NOTE) The eGFR has been calculated using the CKD EPI equation. This calculation has not been validated in all clinical situations. eGFR's persistently <60 mL/min signify possible Chronic Kidney Disease.    Anion gap 8 5 - 15  Protime-INR     Status: None   Collection Time: 03/02/16  5:09 PM  Result Value Ref Range   Prothrombin Time 13.1 11.6 - 15.2 seconds   INR 1.01 0.00 - 1.49  Urinalysis, Routine w reflex microscopic (not at [facility])     Status: Abnormal   Collection Time: 03/02/16  5:17 PM  Result Value Ref Range   Color, Urine YELLOW YELLOW   APPearance CLEAR CLEAR   Specific Gravity, Urine 1.013 1.005 - 1.030   pH 6.0 5.0 - 8.0   Glucose, UA NEGATIVE NEGATIVE mg/dL   Hgb urine dipstick NEGATIVE NEGATIVE   Bilirubin Urine NEGATIVE NEGATIVE   Ketones, ur NEGATIVE NEGATIVE mg/dL   Protein, ur NEGATIVE NEGATIVE mg/dL   Nitrite NEGATIVE NEGATIVE   Leukocytes, UA LARGE (A) NEGATIVE  Urine microscopic-add  on     Status: Abnormal   Collection Time: 03/02/16  5:17 PM  Result Value Ref Range   Squamous Epithelial / LPF 6-30 (A) NONE SEEN   WBC, UA 6-30 0 - 5 WBC/hpf   RBC / HPF 0-5 0 - 5 RBC/hpf   Bacteria, UA RARE (A) NONE SEEN   Mr Lumbar Spine Wo Contrast  03/01/2016  CLINICAL DATA:  Chronic low back pain. Symptoms began 2 weeks ago. No reported injury. EXAM: MRI LUMBAR SPINE WITHOUT CONTRAST TECHNIQUE: Multiplanar, multisequence MR imaging of the lumbar spine was performed. No intravenous contrast was administered. COMPARISON:  MRI lumbar spine 02/04/2013. FINDINGS: Segmentation: Normal. Alignment: There is slight retrolisthesis L2 on L3, estimated 3 mm. This is associated with the retropulsed bone fragment into the canal described below. 4 mm anterolisthesis L4-5. Vertebrae: There is significant (up to 50%) loss of height at L3 associated with a T2 and STIR hyperintense fluid collection throughout the superior endplate, extending into the disc space. The appearance is that of an acute or subacute L3 compression fracture, with osteonecrosis and fluid dissecting retrograde into L2-L3. Infection is possible but less favored. Conus medullaris: Normal in size, signal, and location. Paraspinal tissues: No evidence for hydronephrosis or paravertebral mass. Disc levels: L1-L2: Mild bulge. Moderate posterior element hypertrophy. Mild stenosis. Either L2 nerve root could be affected. L2-L3: 4-6 mm retropulsed fragment superior posterior aspect of L3, greater on the LEFT. Critical spinal stenosis. Superimposed posterior element hypertrophy with associated disc protrusion. LEFT greater than RIGHT L3 nerve root impingement. Serpiginous nerve roots are seen opposite L2 in the canal, implying severe stenosis. BILATERAL foraminal narrowing, slightly worse on the LEFT, could affect the L2 nerve roots. L3-L4: Central and leftward extrusion. Extraforaminal component extends more to the RIGHT. Posterior element hypertrophy.  Severe stenosis. BILATERAL L4 nerve root in the canal. RIGHT L3 nerve root impingement in the foramen and beyond. L4-L5: Central disc extrusion. 4 mm anterolisthesis. Advanced posterior element hypertrophy. Severe spinal stenosis. LEFT greater than RIGHT L5 nerve root impingement. BILATERAL foraminal narrowing possibly affects the L4 nerve roots. L5-S1: Central protrusion. Posterior element hypertrophy. Mild stenosis. No definite S1 or L5 nerve root impingement. IMPRESSION: Suspected acute or subacute compression fracture of L3,  superior endplate, up to 50%, with osteonecrotic cleft extending into the L2-3 interspace. Retropulsed fragment at this level results in critical stenosis. Infection is less favored. If there are signs and symptoms concerning for discitis or osteomyelitis, post infusion imaging could be considered. Severe multifactorial spinal stenosis is also present at L4-5 and L3-4, contributing to potentially symptomatic neural impingement at those levels. See discussion above. These results will be called to the ordering clinician or representative by the Radiologist Assistant, and communication documented in the PACS or zVision Dashboard. Electronically Signed   By: Staci Righter M.D.   On: 03/01/2016 16:34    Review of Systems  HENT: Negative.   Eyes: Negative.   Respiratory: Negative.   Cardiovascular: Negative.   Gastrointestinal: Negative.   Genitourinary:       Difficulty originating micturition  Musculoskeletal: Positive for back pain.  Skin: Negative.   Neurological: Positive for focal weakness and weakness.  Psychiatric/Behavioral: Negative.     Blood pressure 147/71, pulse 93, temperature 98.1 F (36.7 C), temperature source Oral, resp. rate 18, SpO2 94 %. Physical Exam  Constitutional: She is oriented to person, place, and time. She appears well-developed and well-nourished. She appears distressed.  HENT:  Head: Normocephalic and atraumatic.  Eyes: Conjunctivae and EOM  are normal. Pupils are equal, round, and reactive to light.  Neck: Normal range of motion. Neck supple.  Cardiovascular: Normal rate, regular rhythm and normal heart sounds.   Respiratory: Effort normal and breath sounds normal.  GI: Soft. Bowel sounds are normal.  Musculoskeletal:       Lumbar back: She exhibits tenderness.  Neurological: She is alert and oriented to person, place, and time. She displays normal reflexes. No cranial nerve deficit. She exhibits normal muscle tone. Coordination normal.  Skin: Skin is warm and dry.  Psychiatric: She has a normal mood and affect. Her behavior is normal. Judgment and thought content normal.     Assessment/Plan Admit for OR tomorrow. I have explained the situation to her family. Given the pain she is experiencing I do not believe that further conservative care would be successful. Kyphoplasty is not available because of the retropulsion present. I do believe she will need instrumented fixation and decompression at L2/3,3/4, and 4/5. Risks and benefits including bleeding, infection, fusion failure, hardware failure, need for further surgery, and other risks were discussed. She and her family would like to proceed.   Edder Bellanca L, MD 03/02/2016, 7:44 PM

## 2016-03-03 ENCOUNTER — Inpatient Hospital Stay (HOSPITAL_COMMUNITY): Payer: Medicare Other

## 2016-03-03 ENCOUNTER — Encounter (HOSPITAL_COMMUNITY)
Admission: EM | Disposition: A | Discharge status: Home Health | Payer: Self-pay | Source: Home / Self Care | Attending: Neurosurgery

## 2016-03-03 ENCOUNTER — Inpatient Hospital Stay (HOSPITAL_COMMUNITY): Payer: Medicare Other | Admitting: Anesthesiology

## 2016-03-03 ENCOUNTER — Encounter (HOSPITAL_COMMUNITY): Payer: Self-pay | Admitting: Anesthesiology

## 2016-03-03 ENCOUNTER — Other Ambulatory Visit: Payer: Self-pay | Admitting: Neurosurgery

## 2016-03-03 HISTORY — PX: KYPHOPLASTY: SHX5884

## 2016-03-03 LAB — SURGICAL PCR SCREEN
MRSA, PCR: NEGATIVE
STAPHYLOCOCCUS AUREUS: POSITIVE — AB

## 2016-03-03 SURGERY — KYPHOPLASTY
Anesthesia: General | Site: Back

## 2016-03-03 MED ORDER — GLYCOPYRROLATE 0.2 MG/ML IJ SOLN
INTRAMUSCULAR | Status: DC | PRN
  Administered 2016-03-03: 0.2 mg via INTRAVENOUS

## 2016-03-03 MED ORDER — PROPOFOL 10 MG/ML IV BOLUS
INTRAVENOUS | Status: AC
  Filled 2016-03-03: qty 20

## 2016-03-03 MED ORDER — SUCCINYLCHOLINE CHLORIDE 20 MG/ML IJ SOLN
INTRAMUSCULAR | Status: DC | PRN
  Administered 2016-03-03: 50 mg via INTRAVENOUS

## 2016-03-03 MED ORDER — ONDANSETRON HCL 4 MG/2ML IJ SOLN
INTRAMUSCULAR | Status: DC | PRN
  Administered 2016-03-03: 4 mg via INTRAVENOUS

## 2016-03-03 MED ORDER — PHENYLEPHRINE HCL 10 MG/ML IJ SOLN
INTRAMUSCULAR | Status: DC | PRN
  Administered 2016-03-03: 120 ug via INTRAVENOUS
  Administered 2016-03-03 (×2): 80 ug via INTRAVENOUS
  Administered 2016-03-03 (×2): 40 ug via INTRAVENOUS

## 2016-03-03 MED ORDER — 0.9 % SODIUM CHLORIDE (POUR BTL) OPTIME
TOPICAL | Status: DC | PRN
  Administered 2016-03-03: 1000 mL

## 2016-03-03 MED ORDER — CEFAZOLIN SODIUM 1 G IJ SOLR
INTRAMUSCULAR | Status: AC
  Filled 2016-03-03: qty 20

## 2016-03-03 MED ORDER — FENTANYL CITRATE (PF) 250 MCG/5ML IJ SOLN
INTRAMUSCULAR | Status: AC
  Filled 2016-03-03: qty 5

## 2016-03-03 MED ORDER — LIDOCAINE-EPINEPHRINE 0.5 %-1:200000 IJ SOLN
INTRAMUSCULAR | Status: DC | PRN
  Administered 2016-03-03: 8 mL

## 2016-03-03 MED ORDER — SODIUM CHLORIDE 0.9 % IV SOLN
INTRAVENOUS | Status: DC | PRN
  Administered 2016-03-03: 17:00:00

## 2016-03-03 MED ORDER — CEFAZOLIN SODIUM-DEXTROSE 2-4 GM/100ML-% IV SOLN: 2.0000 g | INTRAVENOUS | Status: DC

## 2016-03-03 MED ORDER — PHENYLEPHRINE HCL 10 MG/ML IJ SOLN
10.0000 mg | INTRAVENOUS | Status: DC | PRN
  Administered 2016-03-03: 30 ug/min via INTRAVENOUS

## 2016-03-03 MED ORDER — MORPHINE SULFATE (PF) 2 MG/ML IV SOLN
2.0000 mg | INTRAVENOUS | Status: DC | PRN
  Administered 2016-03-03 – 2016-03-05 (×7): 2 mg via INTRAVENOUS
  Filled 2016-03-03 (×7): qty 1

## 2016-03-03 MED ORDER — SUGAMMADEX SODIUM 200 MG/2ML IV SOLN
INTRAVENOUS | Status: DC | PRN
  Administered 2016-03-03: 150 mg via INTRAVENOUS

## 2016-03-03 MED ORDER — OXYCODONE-ACETAMINOPHEN 5-325 MG PO TABS
1.0000 | ORAL_TABLET | Freq: Four times a day (QID) | ORAL | Status: DC | PRN
  Administered 2016-03-04 – 2016-03-06 (×5): 1 via ORAL
  Filled 2016-03-03 (×7): qty 1

## 2016-03-03 MED ORDER — OXYCODONE-ACETAMINOPHEN 10-325 MG PO TABS: 1.0000 | ORAL_TABLET | Freq: Four times a day (QID) | ORAL | Status: DC | PRN

## 2016-03-03 MED ORDER — ROCURONIUM BROMIDE 100 MG/10ML IV SOLN
INTRAVENOUS | Status: DC | PRN
  Administered 2016-03-03: 30 mg via INTRAVENOUS

## 2016-03-03 MED ORDER — FENTANYL CITRATE (PF) 100 MCG/2ML IJ SOLN
INTRAMUSCULAR | Status: DC | PRN
  Administered 2016-03-03: 50 ug via INTRAVENOUS
  Administered 2016-03-03: 100 ug via INTRAVENOUS

## 2016-03-03 MED ORDER — MIDAZOLAM HCL 2 MG/2ML IJ SOLN
INTRAMUSCULAR | Status: AC
  Filled 2016-03-03: qty 2

## 2016-03-03 MED ORDER — LACTATED RINGERS IV SOLN
INTRAVENOUS | Status: DC | PRN
  Administered 2016-03-03: 15:00:00 via INTRAVENOUS

## 2016-03-03 MED ORDER — GLYCOPYRROLATE 0.2 MG/ML IJ SOLN
INTRAMUSCULAR | Status: AC
  Filled 2016-03-03: qty 1

## 2016-03-03 MED ORDER — PROPOFOL 10 MG/ML IV BOLUS
INTRAVENOUS | Status: DC | PRN
  Administered 2016-03-03: 20 mg via INTRAVENOUS
  Administered 2016-03-03: 30 mg via INTRAVENOUS
  Administered 2016-03-03: 20 mg via INTRAVENOUS

## 2016-03-03 MED ORDER — CEFAZOLIN SODIUM-DEXTROSE 2-3 GM-% IV SOLR
INTRAVENOUS | Status: DC | PRN
  Administered 2016-03-03: 2 g via INTRAVENOUS

## 2016-03-03 MED ORDER — OXYCODONE HCL 5 MG PO TABS
5.0000 mg | ORAL_TABLET | Freq: Four times a day (QID) | ORAL | Status: DC | PRN
  Administered 2016-03-04 – 2016-03-06 (×4): 5 mg via ORAL
  Filled 2016-03-03 (×5): qty 1

## 2016-03-03 MED ORDER — SUGAMMADEX SODIUM 200 MG/2ML IV SOLN
INTRAVENOUS | Status: AC
  Filled 2016-03-03: qty 2

## 2016-03-03 MED ORDER — LIDOCAINE HCL (CARDIAC) 20 MG/ML IV SOLN
INTRAVENOUS | Status: DC | PRN
  Administered 2016-03-03: 40 mg via INTRAVENOUS

## 2016-03-03 MED ORDER — PHENYLEPHRINE HCL 10 MG/ML IJ SOLN
INTRAMUSCULAR | Status: AC
  Filled 2016-03-03: qty 1

## 2016-03-03 SURGICAL SUPPLY — 39 items
BLADE CLIPPER SURG (BLADE) IMPLANT
BLADE SURG 15 STRL LF DISP TIS (BLADE) ×1 IMPLANT
BLADE SURG 15 STRL SS (BLADE) ×3
CEMENT KYPHON C01A KIT/MIXER (Cement) ×2 IMPLANT
DRAPE C-ARM 42X72 X-RAY (DRAPES) ×3 IMPLANT
DRAPE LAPAROTOMY 100X72X124 (DRAPES) ×3 IMPLANT
DRAPE PROXIMA HALF (DRAPES) ×3 IMPLANT
DRAPE SURG 17X23 STRL (DRAPES) ×3 IMPLANT
DURAPREP 26ML APPLICATOR (WOUND CARE) ×3 IMPLANT
GAUZE SPONGE 4X4 16PLY XRAY LF (GAUZE/BANDAGES/DRESSINGS) ×3 IMPLANT
GLOVE BIO SURGEON STRL SZ 6.5 (GLOVE) ×1 IMPLANT
GLOVE BIO SURGEONS STRL SZ 6.5 (GLOVE) ×1
GLOVE BIOGEL PI IND STRL 6.5 (GLOVE) IMPLANT
GLOVE BIOGEL PI INDICATOR 6.5 (GLOVE) ×2
GLOVE ECLIPSE 6.5 STRL STRAW (GLOVE) ×3 IMPLANT
GLOVE EXAM NITRILE LRG STRL (GLOVE) IMPLANT
GLOVE EXAM NITRILE MD LF STRL (GLOVE) IMPLANT
GLOVE EXAM NITRILE XL STR (GLOVE) IMPLANT
GLOVE EXAM NITRILE XS STR PU (GLOVE) IMPLANT
GOWN STRL REUS W/ TWL LRG LVL3 (GOWN DISPOSABLE) ×2 IMPLANT
GOWN STRL REUS W/ TWL XL LVL3 (GOWN DISPOSABLE) IMPLANT
GOWN STRL REUS W/TWL 2XL LVL3 (GOWN DISPOSABLE) IMPLANT
GOWN STRL REUS W/TWL LRG LVL3 (GOWN DISPOSABLE) ×6
GOWN STRL REUS W/TWL XL LVL3 (GOWN DISPOSABLE)
KIT BASIN OR (CUSTOM PROCEDURE TRAY) ×3 IMPLANT
KIT ROOM TURNOVER OR (KITS) ×3 IMPLANT
LIQUID BAND (GAUZE/BANDAGES/DRESSINGS) ×3 IMPLANT
NDL HYPO 25X1 1.5 SAFETY (NEEDLE) ×1 IMPLANT
NEEDLE HYPO 25X1 1.5 SAFETY (NEEDLE) ×3 IMPLANT
NS IRRIG 1000ML POUR BTL (IV SOLUTION) ×3 IMPLANT
PACK SURGICAL SETUP 50X90 (CUSTOM PROCEDURE TRAY) ×3 IMPLANT
PAD ARMBOARD 7.5X6 YLW CONV (MISCELLANEOUS) ×9 IMPLANT
SPECIMEN JAR SMALL (MISCELLANEOUS) IMPLANT
STAPLER SKIN PROX WIDE 3.9 (STAPLE) ×1 IMPLANT
SUT VIC AB 3-0 SH 8-18 (SUTURE) ×3 IMPLANT
SYR CONTROL 10ML LL (SYRINGE) ×4 IMPLANT
TOWEL OR 17X24 6PK STRL BLUE (TOWEL DISPOSABLE) ×3 IMPLANT
TOWEL OR 17X26 10 PK STRL BLUE (TOWEL DISPOSABLE) ×3 IMPLANT
TRAY KYPHOPAK 20/3 ONESTEP 1ST (MISCELLANEOUS) ×2 IMPLANT

## 2016-03-03 NOTE — Progress Notes (Signed)
Pt back to room from OR; pt A&O x4; neuro check wnl; VSS; pt denies any pain; back incision clean, dry and intact with dermabond; no active bleeding or drainage noted. IV intact and transfusing. SCD's on; foley intact and unclamped. Pt in bed with call light within reach. Will closely monitor pt. Dionne BucyP. Amo Benedict Kue RN

## 2016-03-03 NOTE — Anesthesia Preprocedure Evaluation (Addendum)
Anesthesia Evaluation  Patient identified by MRN, date of birth, ID band Patient awake    Reviewed: Allergy & Precautions, NPO status , Patient's Chart, lab work & pertinent test results  History of Anesthesia Complications Negative for: history of anesthetic complications  Airway Mallampati: II  TM Distance: >3 FB Neck ROM: Full    Dental  (+) Teeth Intact, Dental Advisory Given   Pulmonary    breath sounds clear to auscultation       Cardiovascular hypertension, Pt. on medications and Pt. on home beta blockers  Rhythm:Regular     Neuro/Psych Spinal stenosis    GI/Hepatic   Endo/Other  diabetes, Well Controlled, Oral Hypoglycemic Agents  Renal/GU      Musculoskeletal   Abdominal   Peds  Hematology   Anesthesia Other Findings   Reproductive/Obstetrics                            Anesthesia Physical Anesthesia Plan  ASA: III  Anesthesia Plan: General   Post-op Pain Management:    Induction: Intravenous  Airway Management Planned: Oral ETT  Additional Equipment: None  Intra-op Plan:   Post-operative Plan: Extubation in OR  Informed Consent: I have reviewed the patients History and Physical, chart, labs and discussed the procedure including the risks, benefits and alternatives for the proposed anesthesia with the patient or authorized representative who has indicated his/her understanding and acceptance.   Dental advisory given  Plan Discussed with: CRNA and Surgeon  Anesthesia Plan Comments:         Anesthesia Quick Evaluation

## 2016-03-03 NOTE — Anesthesia Procedure Notes (Signed)
Procedure Name: Intubation Date/Time: 03/03/2016 4:01 PM Performed by: Faustino CongressWHITE, Torianna Junio TENA Aldric Wenzler Pre-anesthesia Checklist: Patient identified, Emergency Drugs available, Suction available and Patient being monitored Patient Re-evaluated:Patient Re-evaluated prior to inductionOxygen Delivery Method: Circle System Utilized Preoxygenation: Pre-oxygenation with 100% oxygen Intubation Type: IV induction Ventilation: Mask ventilation without difficulty Laryngoscope Size: Mac and 3 Grade View: Grade I Tube type: Oral Tube size: 7.0 mm Number of attempts: 1 Airway Equipment and Method: Stylet Placement Confirmation: ETT inserted through vocal cords under direct vision,  positive ETCO2 and breath sounds checked- equal and bilateral Secured at: 22 cm Tube secured with: Tape Dental Injury: Teeth and Oropharynx as per pre-operative assessment

## 2016-03-03 NOTE — Progress Notes (Signed)
Arrived to room 5C17 via [facility] Dx: L2 compressed fx. Daughter @ bedside. Alert & oriented x 4.

## 2016-03-03 NOTE — Care Management Note (Signed)
Case Management Note  Patient Details  Name: Kristen Wallace MRN: [id number] Date of Birth: [birth year]  Subjective/Objective:                    Action/Plan: Patient was admitted with back pain and L3 fracture.  Plan is for surgery today, likely will need instrumented fixation and decompression at L2/3,3/4, and 4/5, per MD.  Patient lives at home alone. Will follow for discharge needs pending post-op PT/OT evals and physician orders.  Expected Discharge Date:                  Expected Discharge Plan:     In-House Referral:     Discharge planning Services     Post Acute Care Choice:    Choice offered to:     DME Arranged:    DME Agency:     HH Arranged:    HH Agency:     Status of Service:  In process, will continue to follow  Medicare Important Message Given:    Date Medicare IM Given:    Medicare IM give by:    Date Additional Medicare IM Given:    Additional Medicare Important Message give by:     If discussed at Long Length of Stay Meetings, dates discussed:    Additional CommentsAnda Wallace:  Kristen Jolliff C, RN 03/03/2016, 11:22 AM [phone]

## 2016-03-03 NOTE — Transfer of Care (Signed)
Immediate Anesthesia Transfer of Care Note  Patient: Kristen Wallace  Procedure(s) Performed: Procedure(s): Lumbar three kyphoplasty  (N/A)  Patient Location: PACU  Anesthesia Type:General  Level of Consciousness: awake, alert  and patient cooperative  Airway & Oxygen Therapy: Patient Spontanous Breathing and Patient connected to nasal cannula oxygen  Post-op Assessment: Report given to RN and Post -op Vital signs reviewed and stable  Post vital signs: Reviewed and stable  Last Vitals:  Filed Vitals:   03/03/16 1006 03/03/16 1424  BP: 145/50 142/52  Pulse: 69 69  Temp: 36.7 C 37.2 C  Resp: 16 20    Complications: No apparent anesthesia complications

## 2016-03-03 NOTE — Op Note (Signed)
03/02/2016 - 03/03/2016  5:29 PM  PATIENT:  Kristen BakeHelen Benkert  82 y.o. female with an acute L3 fracture and severe pain .   PRE-OPERATIVE DIAGNOSIS:  Lumbar three fracture  POST-OPERATIVE DIAGNOSIS:  Lumbar three fracture  PROCEDURE:  Procedure(s): Lumbar three kyphoplasty   SURGEON:  Surgeon(s): Coletta MemosKyle Analysse Quinonez, MD  ANESTHESIA:   local and general  EBL:  Total I/O In: 600 [I.V.:600] Out: 1855 [Urine:1850; Blood:5]  BLOOD ADMINISTERED:none  COUNT:per nursing  SPECIMEN:  No Specimen  DICTATION: Mrs. [name] was taken to the operating room, intubated and placed under a general anesthetic without difficulty. She was positioned prone on the operating room table with all pressure points properly padded. Her back was prepped and draped in a sterile manner. With fluoroscopy I localized the L3 pedicles bilaterally. I injected lidocaine into the entry sites on both the left and right sides. I started by making a stab incision on the left side and entering the left L3 pedicle with fluoroscopic guidance. Once good position was obtained, I drilled into the vertebral body. I then placed the kyphoplasty balloon into the L3 vertebra and inflated the balloon. I then inserted 3.0cc of methylmethacrylate into the vertebral body on the left, and 4.5cc on the right side under fluoroscopic guidance. I achieved a good fill of the cavity, staying within the confines of the vertebral body. I removed the instrumentation from the vertebral body, and the final films looked good. I closed the stab incision with vicryl suture and used Dermabond for a sterile dressing. Marland Kitchen.    PLAN OF CARE: Admit to inpatient   PATIENT DISPOSITION:  PACU - hemodynamically stable.   Delay start of Pharmacological VTE agent (>24hrs) due to surgical blood loss or risk of bleeding:  yes

## 2016-03-04 ENCOUNTER — Encounter (HOSPITAL_COMMUNITY): Payer: Self-pay | Admitting: Neurosurgery

## 2016-03-04 LAB — URINE CULTURE

## 2016-03-04 LAB — GLUCOSE, CAPILLARY: GLUCOSE-CAPILLARY: 151 mg/dL — AB (ref 65–99)

## 2016-03-04 MED ORDER — WHITE PETROLATUM GEL
Status: AC
  Administered 2016-03-04: 0.2
  Filled 2016-03-04: qty 1

## 2016-03-04 NOTE — Progress Notes (Signed)
Patient ID: Kristen Wallace, female   DOB: 10-May-1926, 82 y.o.   MRN: [id number]  BP 135/56 mmHg  Pulse 68  Temp(Src) 98.8 F (37.1 C) (Oral)  Resp 20  Ht 5\' 5"  (1.651 m)  Wt 67.858 kg (149 lb 9.6 oz)  BMI 24.89 kg/m2  SpO2 96% Alert and oriented x 4, speech is clear And fluent Moving all extremities well Has been out of bed today, working with pt Discharge Sunday, monday

## 2016-03-04 NOTE — Progress Notes (Signed)
Per pt family, Dr. Franky Machoabbell stated he wanted pt to have a back brace ordered.  No orders for brace and no notes written about brace.  Foley not removed in am since orders need to be clarified if pt is able to get up without brace.  Will report to day nurse.    Estanislado EmmsAshley Schwarz, RN

## 2016-03-04 NOTE — Clinical Documentation Improvement (Signed)
Neuro Surgery  Please clarify if the following diagnosis,  Cauda equina syndrome was:   Present at the time of admission (POA)  NOT present at the time of admission and it developed during the inpatient stay  Unable to clinically determine whether the condition was present on admission.  Unknown   Supporting Information: H + P  " Difficulty originating micturition / .Marland Kitchen.Marland Kitchen.Marland Kitchen."rectal exam with significantly decreased to no tone" ... "numbness (saddle)"  with back pain   Please exercise your independent, professional judgment when responding. A specific answer is not anticipated or expected.   Thank You,  Lavonda JumboLawanda J Cletis Clack [employer] [facility] [place] [phone]

## 2016-03-04 NOTE — Care Management Important Message (Signed)
Important Message  Patient Details  Name: Kristen Wallace MRN: [id number] Date of Birth: November 14, 1926   Medicare Important Message Given:  Yes    [name] Abena 03/04/2016, 10:45 AM

## 2016-03-04 NOTE — Progress Notes (Signed)
Writer offered to get patient up, but patient's daughter wanted the physical therapist to do that themselves. She is concerned that since patient was very weak prior to surgery that she will feel comfortable with PT gettting her up.

## 2016-03-04 NOTE — Clinical Documentation Improvement (Signed)
Neuro Surgery  Abnormal Lab/Test Results:  BUN 28/ Creatinine 1.10   Possible Clinical Conditions associated with below indicators      Stage 1     Stage 2 (mild)    Stage 3 (moderate)    Stage 4 (severe)    Stage 5      Other (please specify)      Unable to determine     Unknown      Please also indicate if the kidney disease stage has progressed during stay    Other Condition  Cannot Clinically Determine    Evaluation: CMET  Treatment Provided: NS @ 80 ml/hr    Associated conditions:   HTN, H/o Chronic renal insufficiency   Please exercise your independent, professional judgment when responding. A specific answer is not anticipated or expected.   Thank You,  Kristen JumboLawanda J Sokha Wallace [facility] [employer] [facility] [place] [phone]

## 2016-03-04 NOTE — Progress Notes (Signed)
Writer spoke with one of the therapist to let her know that patient has a PT order. PT said she will let supervisor know so as to see who could pick patient up.

## 2016-03-04 NOTE — Evaluation (Signed)
Physical Therapy Evaluation Patient Details Name: Kristen Wallace MRN: [id number] DOB: 05-20-1926 Today's Date: 03/04/2016   History of Present Illness  Patient is an 82 y/o female admitted with h/o chronic low back pain for years. She has had this treated conservatively, and successfully. Aprroximately 5 weeks ago she fell while sitting down, the chair moved and she landed on her bottom. The past 2-3 days, she complained of severe pain in the lower back, and weakness in her lower extremities. She has had problems initiating urination, and problems with bowel movements. This led her to come to [facility] hospital for care today. An MRI of the lumbar spine was performed yesterday showing a compression fracture of L3, severe stenosis at L2/3, 3/4, and 4/5.  Now s/p L3 kyphoplasty.  Clinical Impression  Patient presents with decreased independence with mobility due to deficits listed in PT problem list below and will benefit from skilled PT in the acute setting to allow either return home with HHPT and 24 hour assist or to home following STSNF level rehab stay.  Patient's daughter reprots she may have coverage for in home care when she is at work and will check coverage; also notified RN case Production designer, theatre/television/film.    Follow Up Recommendations SNF;Supervision/Assistance - 24 hour (versus home with HHPT and 24 hour assist)    Equipment Recommendations  None recommended by PT    Recommendations for Other Services       Precautions / Restrictions Precautions Precautions: Fall Required Braces or Orthoses: Spinal Brace Spinal Brace: Lumbar corset;Applied in sitting position      Mobility  Bed Mobility Overal bed mobility: Needs Assistance Bed Mobility: Rolling;Sidelying to Sit Rolling: Supervision Sidelying to sit: Mod assist       General bed mobility comments: used rail wtih cues to roll, assist to bring legs off bed and to lift trunk pt using rail  Transfers Overall transfer level: Needs  assistance Equipment used: Rolling walker (2 wheeled) Transfers: Sit to/from Stand Sit to Stand: Min assist         General transfer comment: cues for hand placement  Ambulation/Gait Ambulation/Gait assistance: Min assist Ambulation Distance (Feet): 35 Feet Assistive device: Rolling walker (2 wheeled) Gait Pattern/deviations: Step-through pattern;Decreased stride length;Antalgic     General Gait Details: c/o weakness and pain; feeling like she will collapse with in room ambulation, but no signs of collapse or LOB, cues for maintaining use of walker and encouragement given,  Stairs            Wheelchair Mobility    Modified Rankin (Stroke Patients Only)       Balance Overall balance assessment: Needs assistance   Sitting balance-Leahy Scale: Fair     Standing balance support: Bilateral upper extremity supported Standing balance-Leahy Scale: Poor Standing balance comment: needs UE support and physical help for safety                             Pertinent Vitals/Pain Pain Assessment: 0-10 Pain Score: 10-Worst pain ever Pain Location: R hip with ambulation Pain Descriptors / Indicators: Aching;Sharp Pain Intervention(s): Repositioned;Monitored during session;Limited activity within patient's tolerance    Home Living Family/patient expects to be discharged to:: Skilled nursing facility Living Arrangements: Alone Available Help at Discharge: Family;Available PRN/intermittently Type of Home: Other(Comment) (condo) Home Access: Level entry     Home Layout: One level Home Equipment: Walker - 4 wheels;Wheelchair - manual;Grab bars - tub/shower  Prior Function Level of Independence: Needs assistance   Gait / Transfers Assistance Needed: mod I with ambulation with rollator     Comments: assist for groceries and cleaning     Hand Dominance        Extremity/Trunk Assessment               Lower Extremity Assessment: RLE  deficits/detail RLE Deficits / Details: able to lift antigravity, but painful, strength grossly 4/5    Cervical / Trunk Assessment: Kyphotic  Communication   Communication: HOH  Cognition Arousal/Alertness: Awake/alert Behavior During Therapy: WFL for tasks assessed/performed Overall Cognitive Status: Within Functional Limits for tasks assessed                      General Comments General comments (skin integrity, edema, etc.): daughter in room and gave all info due to pt HOH, also has macular degeneration, pt with colostomy so care given to "burp" bag and roll up prior to donning brace with assist at EOB    Exercises        Assessment/Plan    PT Assessment Patient needs continued PT services  PT Diagnosis Difficulty walking;Acute pain;Generalized weakness   PT Problem List Decreased strength;Decreased activity tolerance;Decreased balance;Decreased mobility;Pain;Decreased knowledge of precautions;Decreased safety awareness  PT Treatment Interventions DME instruction;Gait training;Balance training;Functional mobility training;Patient/family education;Therapeutic activities;Therapeutic exercise   PT Goals (Current goals can be found in the Care Plan section) Acute Rehab PT Goals Patient Stated Goal: To go home PT Goal Formulation: With patient/family Time For Goal Achievement: 03/11/16 Potential to Achieve Goals: Fair    Frequency Min 5X/week   Barriers to discharge        Co-evaluation               End of Session Equipment Utilized During Treatment: Back brace;Gait belt Activity Tolerance: Patient limited by pain Patient left: in chair;with call bell/phone within reach;with family/visitor present           Time: 1435-1510 PT Time Calculation (min) (ACUTE ONLY): 35 min   Charges:   PT Evaluation $PT Eval Moderate Complexity: 1 Procedure PT Treatments $Gait Training: 8-22 mins   PT G CodesElray Mcgregor:        Pola Furno 03/04/2016, 4:32 PM  Sheran Lawlessyndi  Lachrisha Ziebarth, PT [phone] 03/04/2016

## 2016-03-05 NOTE — Progress Notes (Signed)
Physical Therapy Treatment Patient Details Name: Kristen Wallace MRN: [id number] DOB: Nov 16, 1926 Today's Date: 03/05/2016    History of Present Illness Patient is an 82 y/o female admitted with h/o chronic low back pain for years. She has had this treated conservatively, and successfully. Aprroximately 5 weeks ago she fell while sitting down, the chair moved and she landed on her bottom. The past 2-3 days, she complained of severe pain in the lower back, and weakness in her lower extremities. She has had problems initiating urination, and problems with bowel movements. This led her to come to [facility] long hospital for care today. An MRI of the lumbar spine was performed yesterday showing a compression fracture of L3, severe stenosis at L2/3, 3/4, and 4/5.  Now s/p L3 kyphoplasty.    PT Comments    Pt making steady progress toward goals with increased activity today and less overall assistance needed. Acute PT to continue during pt's hospital stay.  Follow Up Recommendations  SNF;Supervision/Assistance - 24 hour (vs home with HHPT and 24 hr assistance)     Equipment Recommendations  None recommended by PT    Precautions / Restrictions Precautions Precautions: Fall Required Braces or Orthoses: Spinal Brace Spinal Brace: Lumbar corset;Applied in sitting position Restrictions Weight Bearing Restrictions: No    Mobility  Bed Mobility               General bed mobility comments: not performed: pt in recliner before and after session  Transfers Overall transfer level: Needs assistance Equipment used: Rolling walker (2 wheeled) Transfers: Sit to/from Stand Sit to Stand: Min guard         General transfer comment: from recliner/toilet and to toilet/recliner with cues on safe hand placement, to scoot to edge to stand and for anterior weight shifting to assist with standing.  Ambulation/Gait Ambulation/Gait assistance: Min guard Ambulation Distance (Feet): 115 Feet (+15 to bathroom  before hall ambulation) Assistive device: Rolling walker (2 wheeled) Gait Pattern/deviations: Step-through pattern;Decreased stride length;Trunk flexed;Narrow base of support Gait velocity: decreased Gait velocity interpretation: <1.8 ft/sec, indicative of risk for recurrent falls General Gait Details: cues needed for upright posture, increased step length and walker proximity with gait.       Balance       Standing balance support: No upper extremity supported;During functional activity   Standing balance comment: pt able to stand at sink and wash hands with min guard assist, no balance loss noted.      Cognition Arousal/Alertness: Awake/alert Behavior During Therapy: WFL for tasks assessed/performed Overall Cognitive Status: Within Functional Limits for tasks assessed           Pertinent Vitals/Pain Pain Assessment: 0-10 Pain Score: 6  Pain Location: back Pain Descriptors / Indicators: Aching;Sore Pain Intervention(s): Limited activity within patient's tolerance;Monitored during session;Repositioned;Patient requesting pain meds-RN notified     PT Goals (current goals can now be found in the care plan section) Acute Rehab PT Goals Patient Stated Goal: To go home PT Goal Formulation: With patient/family Time For Goal Achievement: 03/11/16 Potential to Achieve Goals: Fair Progress towards PT goals: Progressing toward goals    Frequency  Min 5X/week    PT Plan Current plan remains appropriate    End of Session   Activity Tolerance: Patient tolerated treatment well Patient left: in chair;with call bell/phone within reach;with chair alarm set;with family/visitor present     Time: 1255-1315 PT Time Calculation (min) (ACUTE ONLY): 20 min  Charges:  $Gait Training: 8-22 mins  Sallyanne KusterBury, Kathy 03/05/2016, 1:25 PM   Sallyanne KusterKathy Bury, PTA, CLT Acute Rehab Services [phone] 03/05/2016, 1:27 PM

## 2016-03-05 NOTE — Progress Notes (Signed)
No acute events Pain better AVSS Moves legs well Stable Family would like patient to go to inpatient rehab at discharge Will see what therapy has to say about dispo options

## 2016-03-05 NOTE — Progress Notes (Signed)
Occupational Therapy Evaluation Patient Details Name: Kristen Wallace MRN: [id number] DOB: February 06, 1926 Today's Date: 03/05/2016    History of Present Illness Patient is an 82 y/o female admitted with h/o chronic low back pain for years. She has had this treated conservatively, and successfully. Aprroximately 5 weeks ago she fell while sitting down, the chair moved and she landed on her bottom. The past 2-3 days, she complained of severe pain in the lower back, and weakness in her lower extremities. She has had problems initiating urination, and problems with bowel movements. This led her to come to [facility] for care today. An MRI of the lumbar spine was performed yesterday showing a compression fracture of L3, severe stenosis at L2/3, 3/4, and 4/5.  Now s/p L3 kyphoplasty.   Clinical Impression   PTA, pt required min assist for bathing and dressing from her daughters and used rollator or WC for mobility inside/outside her house. Pt currently requires min guard assist for ADLs and functinoal transfers with mod verbal cues for back precautions. Educated pt on back precautions, back brace wear protocol, compensatory strategies for ADLs and began home safety/fall prevention education. Pt plans to d/c home with 24/7 assistance from 2 of her daughters - daughter confirmed they will arrange their schedules so the pt has 24/7 help. Pt will benefit from continued acute OT to increase independence and safety with ADLs and mobility to allow for safe discharge home. Recommend HHOT, wide 3in1 and RW-2 wheeled for home use.    Follow Up Recommendations  Home health OT;Supervision/Assistance - 24 hour    Equipment Recommendations  Other (comment) (RW-2wheeled, wide 3in1)    Recommendations for Other Services       Precautions / Restrictions Precautions Precautions: Fall;Back Precaution Booklet Issued: Yes (comment) Precaution Comments: Reviewed back precautions and provided pt with  handout Required Braces or Orthoses: Spinal Brace Spinal Brace: Lumbar corset;Applied in sitting position Restrictions Weight Bearing Restrictions: No      Mobility Bed Mobility               General bed mobility comments: Pt up in chair on OT arrival  Transfers Overall transfer level: Needs assistance Equipment used: Rolling walker (2 wheeled) Transfers: Sit to/from Stand Sit to Stand: Min guard         General transfer comment: Min guard for safety. No physical assist required and no LOB observed or dizziness reported. Verbal cues for safe hand placement on seated surfaces.    Balance Overall balance assessment: Needs assistance Sitting-balance support: No upper extremity supported;Feet supported Sitting balance-Leahy Scale: Fair     Standing balance support: Bilateral upper extremity supported;During functional activity Standing balance-Leahy Scale: Poor Standing balance comment: With increased fatigue, pt leans over RW or sink for support. Advised pt to avoid this in order to avoid excessive bending.                            ADL Overall ADL's : Needs assistance/impaired     Grooming: Wash/dry hands;Min guard;Cueing for safety;Standing Grooming Details (indicate cue type and reason): Cues to avoid leaning on sink to avoid excessive bending Upper Body Bathing: Set up;Sitting   Lower Body Bathing: Min guard;Sit to/from stand;Cueing for compensatory techniques Lower Body Bathing Details (indicate cue type and reason): Able to cross ankle-over-knee, pt will use long-handled sponge and hand-held showerhead Upper Body Dressing : Set up;Sitting   Lower Body Dressing: Min guard;Sit to/from stand;Cueing for compensatory  techniques Lower Body Dressing Details (indicate cue type and reason): Able to cross ankle-over-knee, cues for energy conservation dressing strategies. Toilet Transfer: Min guard;Cueing for Holiday representative Details  (indicate cue type and reason): BSC over toilet, cues to feel BSC on back of legs before reaching back to avoid twisting Toileting- Clothing Manipulation and Hygiene: Min guard;Sit to/from stand;Cueing for safety;Cueing for compensatory techniques Toileting - Clothing Manipulation Details (indicate cue type and reason): Cues to avoid excessive forward bending for pericare - pt with decreased bilateral leg strength and unable to stand and squat for pericare     Functional mobility during ADLs: Min guard;Rolling walker General ADL Comments: Pt moving very cautiously and adhering to most back precautions, but needs some reminders to avoid bending. Daughter present for OT evaluation.     Vision Vision Assessment?: No apparent visual deficits   Perception     Praxis      Pertinent Vitals/Pain Pain Assessment: 0-10 Pain Score: 4  Pain Location: back Pain Descriptors / Indicators: Aching;Sore Pain Intervention(s): Limited activity within patient's tolerance;Monitored during session;Premedicated before session;Repositioned     Hand Dominance Right   Extremity/Trunk Assessment Upper Extremity Assessment Upper Extremity Assessment: Generalized weakness (uanble to fully assess due to back precautions)   Lower Extremity Assessment Lower Extremity Assessment: Defer to PT evaluation   Cervical / Trunk Assessment Cervical / Trunk Assessment: Kyphotic   Communication Communication Communication: HOH   Cognition Arousal/Alertness: Awake/alert Behavior During Therapy: WFL for tasks assessed/performed Overall Cognitive Status: Within Functional Limits for tasks assessed                     General Comments       Exercises       Shoulder Instructions      Home Living Family/patient expects to be discharged to:: Private residence Living Arrangements: Alone Available Help at Discharge: Family;Available 24 hours/day Type of Home: Other(Comment) (Condo) Home Access: Level  entry     Home Layout: One level     Bathroom Shower/Tub: Walk-in Pensions consultant: Handicapped height Bathroom Accessibility: Yes How Accessible: Accessible via walker Home Equipment: Walker - 4 wheels;Cane - single point;Bedside commode;Shower seat;Grab bars - tub/shower;Grab bars - toilet;Hand held shower head;Wheelchair - Civil engineer, contracting: Long-handled sponge        Prior Functioning/Environment Level of Independence: Needs assistance  Gait / Transfers Assistance Needed: mod I with ambulation using RW for short distances and WC for longer distances ADL's / Homemaking Assistance Needed: Supervision for bathing/dressing tasks, has someone come in and clean        OT Diagnosis: Generalized weakness;Acute pain   OT Problem List: Decreased strength;Decreased range of motion;Decreased activity tolerance;Impaired balance (sitting and/or standing);Decreased coordination;Decreased safety awareness;Decreased knowledge of use of DME or AE;Decreased knowledge of precautions;Obesity;Pain   OT Treatment/Interventions: Self-care/ADL training;Therapeutic exercise;Energy conservation;DME and/or AE instruction;Therapeutic activities;Patient/family education;Balance training    OT Goals(Current goals can be found in the care plan section) Acute Rehab OT Goals Patient Stated Goal: to go home OT Goal Formulation: With patient/family Time For Goal Achievement: 03/19/16 Potential to Achieve Goals: Good ADL Goals Pt Will Perform Grooming: with modified independence;standing Pt Will Perform Lower Body Bathing: with supervision;sit to/from stand;sitting/lateral leans Pt Will Perform Lower Body Dressing: with supervision;sit to/from stand;sitting/lateral leans Pt Will Transfer to Toilet: with modified independence;ambulating;bedside commode (over toilet) Pt Will Perform Toileting - Clothing Manipulation and hygiene: with modified  independence;sitting/lateral leans;sit to/from stand Additional ADL Goal #1: Pt will  independently don/doff back brace to increase independence and safety with ADLs and mobility. Additional ADL Goal #2: Pt will demonstrate adherence to 3/3 back precautions to increase safety with ADLs and mobility.  OT Frequency: Min 2X/week   Barriers to D/C:            Co-evaluation              End of Session Equipment Utilized During Treatment: Gait belt;Rolling walker;Back brace Nurse Communication: Mobility status  Activity Tolerance: Patient tolerated treatment well Patient left: in chair;with call bell/phone within reach;with family/visitor present   Time: 1344-1420 OT Time Calculation (min): 36 min Charges:  OT General Charges $OT Visit: 1 Procedure OT Evaluation $OT Eval Moderate Complexity: 1 Procedure OT Treatments $Self Care/Home Management : 8-22 mins G-Codes:    Nils Pyle, OTR/L Pager: [phone] 03/05/2016, 2:41 PM

## 2016-03-06 MED ORDER — OXYCODONE-ACETAMINOPHEN 5-325 MG PO TABS
1.0000 | ORAL_TABLET | ORAL | Status: DC | PRN
  Administered 2016-03-06 – 2016-03-07 (×6): 1 via ORAL
  Filled 2016-03-06 (×6): qty 1

## 2016-03-06 MED ORDER — OXYCODONE HCL 5 MG PO TABS
5.0000 mg | ORAL_TABLET | ORAL | Status: DC | PRN
  Administered 2016-03-06 – 2016-03-07 (×6): 5 mg via ORAL
  Filled 2016-03-06 (×6): qty 1

## 2016-03-06 NOTE — Anesthesia Postprocedure Evaluation (Signed)
Anesthesia Post Note  Patient: Kristen Wallace  Procedure(s) Performed: Procedure(s) (LRB): Lumbar three kyphoplasty  (N/A)  Patient location during evaluation: PACU Anesthesia Type: General Level of consciousness: awake Pain management: pain level controlled Vital Signs Assessment: post-procedure vital signs reviewed and stable Respiratory status: spontaneous breathing and respiratory function stable Cardiovascular status: stable Postop Assessment: no signs of nausea or vomiting Anesthetic complications: no    Last Vitals:  Filed Vitals:   03/06/16 0650 03/06/16 1006  BP: 146/69 124/48  Pulse: 70 71  Temp: 36.6 C 36.6 C  Resp: 18 17    Last Pain:  Filed Vitals:   03/06/16 1006  PainSc: 4                  Malcome Ambrocio

## 2016-03-06 NOTE — Progress Notes (Signed)
Patient continue to have a great amount of post void residual  but stated that has been her norms  Pt voided at 0500 200 mls clear umber urine  and bladder scanned and 432 ml  noted on the scanner. However pt does not want to be I/O cath.

## 2016-03-06 NOTE — Progress Notes (Signed)
Patient ID: Kristen Wallace, female   DOB: [birth year], 82 y.o.   MRN: [id number] Patient doing well making progress still significant deconditioned  Neurologically nonfocal awake alert  Physical occupational therapy patient may need rehabilitation placement

## 2016-03-06 NOTE — Progress Notes (Signed)
Occupational Therapy Treatment Patient Details Name: Kristen Wallace MRN: [id number] DOB: Oct 15, 1926 Today's Date: 03/06/2016    History of present illness Patient is an 82 y/o female admitted with h/o chronic low back pain for years. She has had this treated conservatively, and successfully. Aprroximately 5 weeks ago she fell while sitting down, the chair moved and she landed on her bottom. The past 2-3 days, she complained of severe pain in the lower back, and weakness in her lower extremities. She has had problems initiating urination, and problems with bowel movements. This led her to come to [facility] for care today. An MRI of the lumbar spine was performed yesterday showing a compression fracture of L3, severe stenosis at L2/3, 3/4, and 4/5.  Now s/p L3 kyphoplasty.   OT comments  Pt. Able to complete bed mobility and amb. With toileting tasks this session.  Min guard A with intermittent cues for sequencing and hand placement.  Will continue to follow acutely.  Next session focus on LB ADLS  Follow Up Recommendations  Home health OT;Supervision/Assistance - 24 hour    Equipment Recommendations       Recommendations for Other Services      Precautions / Restrictions Precautions Precautions: Fall;Back Precaution Comments: Reviewed back precautions and provided pt with handout Required Braces or Orthoses: Spinal Brace Spinal Brace: Lumbar corset;Applied in sitting position (pt. and family state brace not fitted properly when delivered, request new brace be delivered rn notified) Restrictions Weight Bearing Restrictions: No       Mobility Bed Mobility Overal bed mobility: Needs Assistance Bed Mobility: Rolling;Sidelying to Sit;Sit to Supine;Sit to Sidelying Rolling: Supervision Sidelying to sit: Min guard   Sit to supine: Min guard Sit to sidelying: Min guard General bed mobility comments: cues for maintaining log roll technique, pt. has bed with hed and leg controls but  no rails, so rails removed to simulate home environment  Transfers Overall transfer level: Needs assistance Equipment used: Rolling walker (2 wheeled) Transfers: Sit to/from [employer] Sit to Stand: Min guard Stand pivot transfers: Min guard       General transfer comment: cues for hand placement before sitting down or standing up    Balance                                   ADL Overall ADL's : Needs assistance/impaired     Grooming: Standing;Supervision/safety Grooming Details (indicate cue type and reason): simulated during amb. and transfers in room         Upper Body Dressing : Set up;Sitting       Toilet Transfer: Min guard;Cueing for safety;BSC;Ambulation;RW;Grab Paramedic Details (indicate cue type and reason): 3n1 over the commode Toileting- Clothing Manipulation and Hygiene: Min guard;Sit to/from stand;Cueing for safety;Cueing for compensatory techniques       Functional mobility during ADLs: Min guard;Rolling walker General ADL Comments: Pt moving very cautiously and adhering to most back precautions, but needs some reminders to avoid bending. Daughter present for session, requests bed mobility practice for rolling and re-positioning      Vision                     Perception     Praxis      Cognition   Behavior During Therapy: [facility] for tasks assessed/performed Overall Cognitive Status: Within Functional Limits for tasks assessed  Extremity/Trunk Assessment               Exercises     Shoulder Instructions       General Comments      Pertinent Vitals/ Pain       Pain Assessment: No/denies pain  Home Living                                          Prior Functioning/Environment              Frequency Min 2X/week     Progress Toward Goals  OT Goals(current goals can now be found in the care plan section)   Progress towards OT goals: Progressing toward goals     Plan Discharge plan remains appropriate    Co-evaluation                 End of Session Equipment Utilized During Treatment: Gait belt;Rolling walker;Back brace   Activity Tolerance Patient tolerated treatment well   Patient Left in bed;with call bell/phone within reach;with family/visitor present   Nurse Communication Other (comment) (notified rn of pts/families request for new brace)        Time: [id number] OT Time Calculation (min): 30 min  Charges: OT General Charges $OT Visit: 1 Procedure OT Treatments $Self Care/Home Management : 23-37 mins  Kristen Wallace, Kristen Wallace, COTA/L 03/06/2016, 9:23 AM

## 2016-03-07 NOTE — Care Management Important Message (Signed)
Important Message  Patient Details  Name: Kristen Wallace MRN: [id number] Date of Birth: 1926-03-20   Medicare Important Message Given:  Yes    Kyla BalzarineShealy, Bryonna Sundby Abena 03/07/2016, 12:43 PM

## 2016-03-07 NOTE — Care Management Note (Signed)
Case Management Note  Patient Details  Name: Kristen Wallace MRN: [id number] Date of Birth: 09-Feb-1926  Subjective/Objective:                    Action/Plan: PT rec is for SNF when medically ready. CM continuing to follow for d/c needs.   Expected Discharge Date:                  Expected Discharge Plan:     In-House Referral:     Discharge planning Services     Post Acute Care Choice:    Choice offered to:     DME Arranged:    DME Agency:     HH Arranged:    HH Agency:     Status of Service:  In process, will continue to follow  Medicare Important Message Given:  Yes Date Medicare IM Given:    Medicare IM give by:    Date Additional Medicare IM Given:    Additional Medicare Important Message give by:     If discussed at Long Length of Stay Meetings, dates discussed:    Additional Comments:  Kermit BaloKelli F Neera Teng, RN 03/07/2016, 3:35 PM

## 2016-03-07 NOTE — Progress Notes (Signed)
Patient discharged to home accompanied by daughter. IV site d/cd, discharge instruction given and verbalized understanding.

## 2016-03-07 NOTE — Progress Notes (Signed)
Physical Therapy Treatment Patient Details Name: Kristen Wallace MRN: [id number] DOB: 04/13/1926 Today's Date: 03/07/2016    History of Present Illness Patient is an 82 y/o female admitted with h/o chronic low back pain for years. She has had this treated conservatively, and successfully. Aprroximately 5 weeks ago she fell while sitting down, the chair moved and she landed on her bottom. The past 2-3 days, she complained of severe pain in the lower back, and weakness in her lower extremities. She has had problems initiating urination, and problems with bowel movements. This led her to come to [facility] for care today. An MRI of the lumbar spine was performed yesterday showing a compression fracture of L3, severe stenosis at L2/3, 3/4, and 4/5.  Now s/p L3 kyphoplasty.    PT Comments    Making very good progress with mobility and activity tolerance; Pt very much wants to dc home, took time to describe to me the ways she manages at home, and she assures me that she will have 24 hour assist initially at home; have updated dc plan to home with HHPT/OT and 24 hour assistance   Follow Up Recommendations  Home health PT;Supervision/Assistance - 24 hour     Equipment Recommendations  None recommended by PT    Recommendations for Other Services       Precautions / Restrictions Precautions Precautions: Fall;Back Precaution Comments: REviewed Back Prec Required Braces or Orthoses: Spinal Brace Spinal Brace: Lumbar corset;Applied in sitting position    Mobility  Bed Mobility                  Transfers Overall transfer level: Needs assistance Equipment used: Rolling walker (2 wheeled) Transfers: Sit to/from Stand Sit to Stand: Min guard         General transfer comment: cues for hand placement before sitting down or standing up; min assist to steady Rollator during transitions  Ambulation/Gait Ambulation/Gait assistance: Min guard Ambulation Distance (Feet): 120  Feet Assistive device: 4-wheeled walker (pt's own ) Gait Pattern/deviations: Step-through pattern Gait velocity: decreased   General Gait Details: cues needed for upright posture, increased step length and walker proximity with gait.   Stairs            Wheelchair Mobility    Modified Rankin (Stroke Patients Only)       Balance     Sitting balance-Leahy Scale: Fair       Standing balance-Leahy Scale: Poor (approaching Fair)                      Cognition Arousal/Alertness: Awake/alert Behavior During Therapy: WFL for tasks assessed/performed Overall Cognitive Status: Within Functional Limits for tasks assessed                      Exercises      General Comments        Pertinent Vitals/Pain Pain Assessment: 0-10 Pain Score: 4  Pain Location: Back Pain Descriptors / Indicators: Aching;Sore Pain Intervention(s): Monitored during session    Home Living                      Prior Function            PT Goals (current goals can now be found in the care plan section) Acute Rehab PT Goals Patient Stated Goal: to go home PT Goal Formulation: With patient/family Time For Goal Achievement: 03/11/16 Potential to Achieve Goals: Fair Progress towards PT  goals: Progressing toward goals    Frequency  Min 5X/week    PT Plan Discharge plan needs to be updated    Co-evaluation             End of Session Equipment Utilized During Treatment: Back brace;Gait belt Activity Tolerance: Patient tolerated treatment well Patient left: in chair;with call bell/phone within reach;with chair alarm set;with family/visitor present     Time: [id number] PT Time Calculation (min) (ACUTE ONLY): 27 min  Charges:  $Gait Training: 23-37 mins                    G Codes:      Olen PelGarrigan, Dan Dissinger Hamff 03/07/2016, 3:38 PM  Van ClinesHolly Laniya Wallace, [place]PT  Acute Rehabilitation Services Pager [phone] Office [phone]

## 2016-03-07 NOTE — Progress Notes (Signed)
Occupational Therapy Treatment Patient Details Name: Kristen Wallace MRN: [id number] DOB: 09/16/1926 Today's Date: 03/07/2016    History of present illness Patient is an 82 y/o female admitted with h/o chronic low back pain for years. She has had this treated conservatively, and successfully. Aprroximately 5 weeks ago she fell while sitting down, the chair moved and she landed on her bottom. The past 2-3 days, she complained of severe pain in the lower back, and weakness in her lower extremities. She has had problems initiating urination, and problems with bowel movements. This led her to come to [facility] for care today. An MRI of the lumbar spine was performed yesterday showing a compression fracture of L3, severe stenosis at L2/3, 3/4, and 4/5.  Now s/p L3 kyphoplasty.   OT comments  Pt progressing well towards occupational therapy goals. Practiced with AE for LB ADLs this session and pt also completed transfers with min guard assist and cues for safety. Daughter present and confirms she will have 24/7 assistance initially until she no longer requires it. Will continue to follow acutely.   Follow Up Recommendations  Home health OT;Supervision/Assistance - 24 hour    Equipment Recommendations  Other (comment) (wide 3in1, RW 2 wheeled)    Recommendations for Other Services      Precautions / Restrictions Precautions Precautions: Fall;Back Precaution Booklet Issued: No Precaution Comments: Pt able to recall 2/3 back precautions, reviewed all precautions Required Braces or Orthoses: Spinal Brace Spinal Brace: Lumbar corset;Applied in sitting position Restrictions Weight Bearing Restrictions: No       Mobility Bed Mobility               General bed mobility comments: Pt up in chair on OT arrival  Transfers Overall transfer level: Needs assistance Equipment used: Rolling walker (2 wheeled) Transfers: Sit to/from Stand Sit to Stand: Min guard         General  transfer comment: min guard for safety. Verbal cues for hand placement. Did not use rollator this session.    Balance Overall balance assessment: Needs assistance Sitting-balance support: No upper extremity supported;Feet supported Sitting balance-Leahy Scale: Good     Standing balance support: Bilateral upper extremity supported;During functional activity Standing balance-Leahy Scale: Poor Standing balance comment: approaching fair - able to maintain balance briefly without UE support for pericare                   ADL Overall ADL's : Needs assistance/impaired                 Upper Body Dressing : Minimal assistance;Sitting Upper Body Dressing Details (indicate cue type and reason): to readjust brace Lower Body Dressing: Min guard;Cueing for sequencing;With adaptive equipment;Sit to/from stand Lower Body Dressing Details (indicate cue type and reason): Sock aid was useful for pt, pt can also cross ankle-over-knee Toilet Transfer: Min guard;Cueing for safety;BSC;Ambulation;RW;Grab Paramedic Details (indicate cue type and reason): wide 3in1 Toileting- Clothing Manipulation and Hygiene: Min guard;Sit to/from stand;Cueing for safety;Cueing for compensatory techniques Toileting - Clothing Manipulation Details (indicate cue type and reason): cues for excessive bending for pericare, educated on use of wet wipes     Functional mobility during ADLs: Min guard;Rolling walker General ADL Comments: Min verbal cues for back precautions this session.      Vision                     Perception     Praxis  Cognition   Behavior During Therapy: WFL for tasks assessed/performed Overall Cognitive Status: Within Functional Limits for tasks assessed       Memory: Decreased recall of precautions               Extremity/Trunk Assessment               Exercises     Shoulder Instructions       General Comments       Pertinent Vitals/ Pain       Pain Assessment: 0-10 Pain Score: 3  Pain Location: back Pain Descriptors / Indicators: Aching;Sore Pain Intervention(s): Limited activity within patient's tolerance;Monitored during session;Repositioned  Home Living                                          Prior Functioning/Environment              Frequency       Progress Toward Goals  OT Goals(current goals can now be found in the care plan section)  Progress towards OT goals: Progressing toward goals  Acute Rehab OT Goals Patient Stated Goal: to go home OT Goal Formulation: With patient/family Time For Goal Achievement: 03/19/16 Potential to Achieve Goals: Good ADL Goals Pt Will Perform Grooming: with modified independence;standing Pt Will Perform Lower Body Bathing: with supervision;sit to/from stand;sitting/lateral leans Pt Will Perform Lower Body Dressing: with supervision;sit to/from stand;sitting/lateral leans Pt Will Transfer to Toilet: with modified independence;ambulating;bedside commode Pt Will Perform Toileting - Clothing Manipulation and hygiene: with modified independence;sitting/lateral leans;sit to/from stand Additional ADL Goal #1: Pt will independently don/doff back brace to increase independence and safety with ADLs and mobility. Additional ADL Goal #2: Pt will demonstrate adherence to 3/3 back precautions to increase safety with ADLs and mobility.  Plan Discharge plan remains appropriate    Co-evaluation                 End of Session Equipment Utilized During Treatment: Gait belt;Rolling walker;Back brace   Activity Tolerance Patient tolerated treatment well   Patient Left in chair;with call bell/phone within reach;with family/visitor present   Nurse Communication Mobility status        Time: [id number] OT Time Calculation (min): 21 min  Charges: OT General Charges $OT Visit: 1 Procedure OT Treatments $Self Care/Home Management  : 8-22 mins  Nils PyleJulia Maximum Reiland, OTR/L Pager: [phone] 03/07/2016, 5:10 PM

## 2016-03-07 NOTE — Discharge Summary (Signed)
Physician Discharge Summary  Patient ID: Kristen Wallace MRN: [id number] DOB/AGE: 1926/01/06 82 y.o.  Admit date: 03/02/2016 Discharge date: 03/07/2016  Admission Diagnoses:L3 compression fracture  Discharge Diagnoses:  Active Problems:   Compression fracture of L3 lumbar vertebra [facility](HCC)   Discharged Condition: good  Hospital Course: Ms. Kristen Wallace was admitted and taken to the operating room for an uncomplicated kyphoplasty of L3. Post op she has had much less pain, is ambulating and tolerating a regular diet. She is voiding. She has been cleared for home health pt/ot. Her family is comfortable with discharge as is the patient at this time.   Treatments: surgery: L3 Kyphoplasty  Discharge Exam: Blood pressure 136/63, pulse 61, temperature 98.9 F (37.2 C), temperature source Oral, resp. rate 18, height 5\' 5"  (1.651 m), weight 67.858 kg (149 lb 9.6 oz), SpO2 99 %. General appearance: alert, cooperative, appears stated age and no distress Neurologic: Mental status: Alert, oriented, thought content appropriate Cranial nerves: normal Motor: moving all extremities well Gait: Normal  Disposition: 01-Home or Self Care Lumbar three fracture Discharge Instructions    Ambulatory referral to Home Health    Complete by:  As directed   Please evaluate Kristen Wallace for admission to Home Health.  Disciplines requested: Physical Therapy and Occupational Therapy  Services to provide: Strengthening Exercises  Physician to follow patient's care (the person listed here will be responsible for signing ongoing orders): Referring Provider  Requested Start of Care Date: Within 2-3 days  I certify that this patient is under my care and that I, or a Nurse Practitioner or Physician's Assistant working with me, had a face-to-face encounter that meets the physician face-to-face requirements with patient on 03/07/2016. The encounter with the patient was in whole, or in part for the following medical condition(s)  which is the primary reason for home health care (List medical condition). Vertebral compression fracture  Special Instructions:  none  Does the patient have Medicare or Medicaid?:  Yes  The encounter with the patient was in whole, or in part, for the following medical condition, which is the primary reason for home health care:  vertebral compression fracture  Reason for Medically Necessary Home Health Services:  Therapy- Instruction on use of Assistive Device for Ambulation on all Surfaces  My clinical findings support the need for the above services:  Unable to leave home safely without assistance and/or assistive device  I certify that, based on my findings, the following services are medically necessary home health services:  Physical therapy  Further, I certify that my clinical findings support that this patient is homebound due to:  Unable to leave home safely without assistance            Medication List    TAKE these medications        ARTIFICIAL TEARS OP  Place 1 drop into both eyes 3 (three) times daily as needed (dry eyes).     atenolol 25 MG tablet  Commonly known as:  TENORMIN  Take 12.5 mg by mouth daily.     citalopram 10 MG tablet  Commonly known as:  CELEXA  Take 10 mg by mouth every morning.     ENABLEX 15 MG 24 hr tablet  Generic drug:  darifenacin  Take 15 mg by mouth daily.     hydrochlorothiazide 25 MG tablet  Commonly known as:  HYDRODIURIL  Take 12.5 mg by mouth every other day.     ICAPS PO  Take 1 capsule by mouth 2 (two) times  daily.     levofloxacin 500 MG tablet  Commonly known as:  LEVAQUIN  Take 1 tablet (500 mg total) by mouth daily.     lidocaine 5 %  Commonly known as:  LIDODERM  APPLY 1 PATCH TO INTACT SKIN REMOVE AFTER 12 HOURS ONCE A DAY EXTERNALLY     multivitamin capsule  Take 1 capsule by mouth daily.     omeprazole 20 MG capsule  Commonly known as:  PRILOSEC  Take 20 mg by mouth daily.     oxyCODONE-acetaminophen 10-325  MG tablet  Commonly known as:  PERCOCET  Take 1 tablet by mouth every 4 (four) hours as needed for pain.     pioglitazone 30 MG tablet  Commonly known as:  ACTOS  Take 15 mg by mouth daily.     predniSONE 5 MG tablet  Commonly known as:  DELTASONE  Take 7.5 mg by mouth daily.     PROBIOTIC PO  Take 1 capsule by mouth daily.     telmisartan 40 MG tablet  Commonly known as:  MICARDIS  Take 40 mg by mouth daily.           Follow-up Information    Follow up with Kristen Seckman L, MD In 3 weeks.   Specialty:  Neurosurgery   Why:  please call the office to make an appointment   Contact information:   [address] [place] [address] [phone]       Signed: [name] 03/07/2016, 7:17 PM

## 2016-03-08 NOTE — Care Management Note (Signed)
Case Management Note  Patient Details  Name: Kristen BakeHelen Wallace MRN: [id number] Date of Birth: [birth year]  Subjective/Objective:                    Action/Plan: Patient discharged late yesterday with [facility]. CM attempted to call the patient this am and was unable to reach her. Patient returned call this afternoon and CM [facility]. [name] states she has used [facility] in the past and would like to use them again. Mary with [facility] notified and accepted the referral. Orders also for walker and 3 in 1. Patient states she already has this equipment but would like the arms on her 3 in 1 adjusted. Mary with [facility] notified of this and will have PT look at the 3 in 1 when they visit.   Expected Discharge Date:                  Expected Discharge Plan:  Home w Home Health Services  In-House Referral:     Discharge planning Services  CM Consult  Post Acute Care Choice:  Durable Medical Equipment, Home Health Choice offered to:  Patient  DME Arranged:  3-N-1, Walker rolling (pt refused) DME Agency:     HH Arranged:    HH Agency:     Status of Service:  Completed, signed off  Medicare Important Message Given:  Yes Date Medicare IM Given:    Medicare IM give by:    Date Additional Medicare IM Given:    Additional Medicare Important Message give by:     If discussed at Long Length of Stay Meetings, dates discussed:    Additional Comments:  Kermit BaloKelli F Ashlin Hidalgo, RN 03/08/2016, 2:38 PM

## 2016-07-29 DIAGNOSIS — K56609 Unspecified intestinal obstruction, unspecified as to partial versus complete obstruction: Secondary | ICD-10-CM

## 2016-07-29 HISTORY — DX: Unspecified intestinal obstruction, unspecified as to partial versus complete obstruction: K56.609

## 2016-08-07 ENCOUNTER — Inpatient Hospital Stay (HOSPITAL_BASED_OUTPATIENT_CLINIC_OR_DEPARTMENT_OTHER)
Admission: EM | Admit: 2016-08-07 | Discharge: 2016-08-11 | DRG: 394 | Disposition: A | Discharge status: Home / Self Care | Payer: Medicare Other | Attending: Family Medicine | Admitting: Family Medicine

## 2016-08-07 ENCOUNTER — Encounter (HOSPITAL_BASED_OUTPATIENT_CLINIC_OR_DEPARTMENT_OTHER): Payer: Self-pay | Admitting: Emergency Medicine

## 2016-08-07 ENCOUNTER — Emergency Department (HOSPITAL_BASED_OUTPATIENT_CLINIC_OR_DEPARTMENT_OTHER): Payer: Medicare Other

## 2016-08-07 DIAGNOSIS — K219 Gastro-esophageal reflux disease without esophagitis: Secondary | ICD-10-CM | POA: Diagnosis present

## 2016-08-07 DIAGNOSIS — I129 Hypertensive chronic kidney disease with stage 1 through stage 4 chronic kidney disease, or unspecified chronic kidney disease: Secondary | ICD-10-CM | POA: Diagnosis present

## 2016-08-07 DIAGNOSIS — Z933 Colostomy status: Secondary | ICD-10-CM | POA: Diagnosis not present

## 2016-08-07 DIAGNOSIS — K5669 Other intestinal obstruction: Secondary | ICD-10-CM | POA: Diagnosis not present

## 2016-08-07 DIAGNOSIS — R079 Chest pain, unspecified: Secondary | ICD-10-CM | POA: Diagnosis not present

## 2016-08-07 DIAGNOSIS — K5909 Other constipation: Secondary | ICD-10-CM | POA: Diagnosis present

## 2016-08-07 DIAGNOSIS — H9190 Unspecified hearing loss, unspecified ear: Secondary | ICD-10-CM | POA: Diagnosis present

## 2016-08-07 DIAGNOSIS — K460 Unspecified abdominal hernia with obstruction, without gangrene: Secondary | ICD-10-CM | POA: Diagnosis present

## 2016-08-07 DIAGNOSIS — I10 Essential (primary) hypertension: Secondary | ICD-10-CM | POA: Diagnosis present

## 2016-08-07 DIAGNOSIS — K430 Incisional hernia with obstruction, without gangrene: Secondary | ICD-10-CM | POA: Diagnosis not present

## 2016-08-07 DIAGNOSIS — Z9049 Acquired absence of other specified parts of digestive tract: Secondary | ICD-10-CM | POA: Diagnosis not present

## 2016-08-07 DIAGNOSIS — E860 Dehydration: Secondary | ICD-10-CM | POA: Diagnosis present

## 2016-08-07 DIAGNOSIS — N189 Chronic kidney disease, unspecified: Secondary | ICD-10-CM | POA: Diagnosis present

## 2016-08-07 DIAGNOSIS — Z0189 Encounter for other specified special examinations: Secondary | ICD-10-CM

## 2016-08-07 DIAGNOSIS — M329 Systemic lupus erythematosus, unspecified: Secondary | ICD-10-CM | POA: Diagnosis present

## 2016-08-07 DIAGNOSIS — M549 Dorsalgia, unspecified: Secondary | ICD-10-CM | POA: Diagnosis present

## 2016-08-07 DIAGNOSIS — Z79899 Other long term (current) drug therapy: Secondary | ICD-10-CM

## 2016-08-07 DIAGNOSIS — E1122 Type 2 diabetes mellitus with diabetic chronic kidney disease: Secondary | ICD-10-CM | POA: Diagnosis present

## 2016-08-07 DIAGNOSIS — R1084 Generalized abdominal pain: Secondary | ICD-10-CM | POA: Diagnosis not present

## 2016-08-07 DIAGNOSIS — G8929 Other chronic pain: Secondary | ICD-10-CM | POA: Diagnosis present

## 2016-08-07 DIAGNOSIS — K56609 Unspecified intestinal obstruction, unspecified as to partial versus complete obstruction: Secondary | ICD-10-CM | POA: Diagnosis present

## 2016-08-07 DIAGNOSIS — I248 Other forms of acute ischemic heart disease: Secondary | ICD-10-CM | POA: Diagnosis present

## 2016-08-07 DIAGNOSIS — R509 Fever, unspecified: Secondary | ICD-10-CM

## 2016-08-07 DIAGNOSIS — E119 Type 2 diabetes mellitus without complications: Secondary | ICD-10-CM

## 2016-08-07 DIAGNOSIS — Z7952 Long term (current) use of systemic steroids: Secondary | ICD-10-CM | POA: Diagnosis not present

## 2016-08-07 HISTORY — DX: Unspecified osteoarthritis, unspecified site: M19.90

## 2016-08-07 HISTORY — DX: Unspecified intestinal obstruction, unspecified as to partial versus complete obstruction: K56.609

## 2016-08-07 LAB — COMPREHENSIVE METABOLIC PANEL
ALT: 15 U/L (ref 14–54)
ANION GAP: 9 (ref 5–15)
AST: 29 U/L (ref 15–41)
Albumin: 3.5 g/dL (ref 3.5–5.0)
Alkaline Phosphatase: 58 U/L (ref 38–126)
BUN: 25 mg/dL — ABNORMAL HIGH (ref 6–20)
CALCIUM: 9 mg/dL (ref 8.9–10.3)
CHLORIDE: 97 mmol/L — AB (ref 101–111)
CO2: 31 mmol/L (ref 22–32)
CREATININE: 0.99 mg/dL (ref 0.44–1.00)
GFR, EST AFRICAN AMERICAN: 57 mL/min — AB (ref >60)
GFR, EST NON AFRICAN AMERICAN: 49 mL/min — AB (ref >60)
Glucose, Bld: 126 mg/dL — ABNORMAL HIGH (ref 65–99)
Potassium: 3.4 mmol/L — ABNORMAL LOW (ref 3.5–5.1)
Sodium: 137 mmol/L (ref 135–145)
Total Bilirubin: 1.8 mg/dL — ABNORMAL HIGH (ref 0.3–1.2)
Total Protein: 5.8 g/dL — ABNORMAL LOW (ref 6.5–8.1)

## 2016-08-07 LAB — CBC WITH DIFFERENTIAL/PLATELET
Basophils Absolute: 0 K/uL (ref 0.0–0.1)
Basophils Relative: 0 %
Eosinophils Absolute: 0 K/uL (ref 0.0–0.7)
Eosinophils Relative: 0 %
HCT: 39.4 % (ref 36.0–46.0)
Hemoglobin: 13.2 g/dL (ref 12.0–15.0)
Lymphocytes Relative: 10 %
Lymphs Abs: 1.2 K/uL (ref 0.7–4.0)
MCH: 34.6 pg — ABNORMAL HIGH (ref 26.0–34.0)
MCHC: 33.5 g/dL (ref 30.0–36.0)
MCV: 103.1 fL — ABNORMAL HIGH (ref 78.0–100.0)
Monocytes Absolute: 1.1 K/uL — ABNORMAL HIGH (ref 0.1–1.0)
Monocytes Relative: 9 %
Neutro Abs: 9.8 K/uL — ABNORMAL HIGH (ref 1.7–7.7)
Neutrophils Relative %: 81 %
Platelets: 162 K/uL (ref 150–400)
RBC: 3.82 MIL/uL — ABNORMAL LOW (ref 3.87–5.11)
RDW: 12 % (ref 11.5–15.5)
WBC: 12.1 K/uL — ABNORMAL HIGH (ref 4.0–10.5)

## 2016-08-07 LAB — I-STAT CG4 LACTIC ACID, ED: Lactic Acid, Venous: 1.3 mmol/L (ref 0.5–1.9)

## 2016-08-07 LAB — TROPONIN I: TROPONIN I: 0.09 ng/mL — AB (ref <0.03)

## 2016-08-07 MED ORDER — FENTANYL CITRATE (PF) 100 MCG/2ML IJ SOLN
25.0000 ug | Freq: Once | INTRAMUSCULAR | Status: AC
  Administered 2016-08-07: 25 ug via INTRAVENOUS
  Filled 2016-08-07: qty 2

## 2016-08-07 MED ORDER — SODIUM CHLORIDE 0.9 % IV BOLUS (SEPSIS)
1000.0000 mL | Freq: Once | INTRAVENOUS | Status: AC
  Administered 2016-08-07: 1000 mL via INTRAVENOUS

## 2016-08-07 MED ORDER — IOPAMIDOL (ISOVUE-300) INJECTION 61%
100.0000 mL | Freq: Once | INTRAVENOUS | Status: AC | PRN
  Administered 2016-08-07: 100 mL via INTRAVENOUS

## 2016-08-07 MED ORDER — ONDANSETRON HCL 4 MG/2ML IJ SOLN
4.0000 mg | Freq: Once | INTRAMUSCULAR | Status: AC
  Administered 2016-08-07: 4 mg via INTRAVENOUS
  Filled 2016-08-07: qty 2

## 2016-08-07 NOTE — ED Provider Notes (Addendum)
[facility] Provider Note   CSN: [id number] Arrival date & time: 08/07/16  1910  By signing my name below, I, Kristen Wallace, attest that this documentation has been prepared under the direction and in the presence of Lon Klippel Randall AnLyn Dickson Kostelnik, MD.  Electronically Signed: Octavia HeirArianna Wallace, ED Scribe. 08/07/16. 8:39 PM.    History   Chief Complaint Chief Complaint  Patient presents with  . Emesis  . Abdominal Pain    The history is provided by the patient and a relative. No language interpreter was used.    HPI Comments: Kristen Wallace is a 82 y.o. female who has a PMHx of HTN, chronic renal insufficiency, lupus, spinal stenosis presents to the Emergency Department complaining of sudden onset, gradual worsening, moderate, emesis onset at 2:30 this morning. Pt's emesis has had a dark brown, coffee color. Per daughter, pt also reports associated generalized abdominal tenderness secondary to vomiting. She notes pt has only been able to drink sips of water since she began vomiting. Pt has not taken any medication to alleviate the pain. She has a left sided abdominal hernia and colostomy bag that she has had for the past 18 years ago. Pt also has a surgical hx of cholecystectomy, appendectomy and hepaticojejunostomy placed. She is not on any anticoagulants or NSAIDS. Pt has not had an EGD before. There are no other complaints.  Past Medical History:  Diagnosis Date  . Back pain, chronic   . Blood transfusion without reported diagnosis   . Chronic renal insufficiency   . Diabetes mellitus without complication (HCC)   . Hypertension   . Lupus (HCC)   . Macular degeneration   . Spinal stenosis     Patient Active Problem List   Diagnosis Date Noted  . Compression fracture of L3 lumbar vertebra (HCC) 03/02/2016  . Septic shock (HCC) 02/04/2013  . Bacteremia 02/04/2013  . Fever and chills 02/03/2013  . Leukocytosis 02/03/2013  . Hypotension, unspecified 02/03/2013    Past  Surgical History:  Procedure Laterality Date  . APPENDECTOMY    . CHOLECYSTECTOMY    . COLOSTOMY    . EYE SURGERY    . KYPHOPLASTY N/A 03/03/2016   Procedure: Lumbar three kyphoplasty ;  Surgeon: Coletta MemosKyle Cabbell, MD;  Location: [facility];  Service: Neurosurgery;  Laterality: N/A;  . TONSILLECTOMY      OB History    No data available       Home Medications    Prior to Admission medications   Medication Sig Start Date End Date Taking? Authorizing Provider  furosemide (LASIX) 40 MG tablet Take 40 mg by mouth daily.   Yes Historical Provider, MD  atenolol (TENORMIN) 25 MG tablet Take 12.5 mg by mouth daily.    Historical Provider, MD  citalopram (CELEXA) 10 MG tablet Take 10 mg by mouth every morning. 02/15/16   Historical Provider, MD  darifenacin (ENABLEX) 15 MG 24 hr tablet Take 15 mg by mouth daily.    Historical Provider, MD  hydrochlorothiazide (HYDRODIURIL) 25 MG tablet Take 12.5 mg by mouth every other day.    Historical Provider, MD  Hypromellose (ARTIFICIAL TEARS OP) Place 1 drop into both eyes 3 (three) times daily as needed (dry eyes).    Historical Provider, MD  levofloxacin (LEVAQUIN) 500 MG tablet Take 1 tablet (500 mg total) by mouth daily. Patient not taking: Reported on 03/02/2016 02/07/13   Vassie Lollarlos Madera, MD  lidocaine (LIDODERM) 5 % APPLY 1 PATCH TO INTACT SKIN REMOVE AFTER 12 HOURS ONCE  A DAY EXTERNALLY 02/10/16   Historical Provider, MD  Multiple Vitamin (MULTIVITAMIN) capsule Take 1 capsule by mouth daily.    Historical Provider, MD  Multiple Vitamins-Minerals (ICAPS PO) Take 1 capsule by mouth 2 (two) times daily.    Historical Provider, MD  omeprazole (PRILOSEC) 20 MG capsule Take 20 mg by mouth daily.    Historical Provider, MD  oxyCODONE-acetaminophen (PERCOCET) 10-325 MG tablet Take 1 tablet by mouth every 4 (four) hours as needed for pain.  02/15/16   Historical Provider, MD  pioglitazone (ACTOS) 30 MG tablet Take 15 mg by mouth daily.     Historical Provider, MD    predniSONE (DELTASONE) 5 MG tablet Take 7.5 mg by mouth daily. 02/12/16   Historical Provider, MD  Probiotic Product (PROBIOTIC PO) Take 1 capsule by mouth daily.    Historical Provider, MD  telmisartan (MICARDIS) 40 MG tablet Take 40 mg by mouth daily.    Historical Provider, MD    Family History History reviewed. No pertinent family history.  Social History Social History  Substance Use Topics  . Smoking status: Never Smoker  . Smokeless tobacco: Never Used  . Alcohol use No     Allergies   Betadine [povidone iodine]   Review of Systems Review of Systems  A complete 10 system review of systems was obtained and all systems are negative except as noted in the HPI and PMH.   Physical Exam Updated Vital Signs Ht 5\' 5"  (1.651 m)   Wt 145 lb (65.8 kg)   BMI 24.13 kg/m   Physical Exam  Constitutional: She is oriented to person, place, and time. She appears well-developed and well-nourished. No distress.  HENT:  Head: Normocephalic and atraumatic.  Eyes: EOM are normal.  Neck: Normal range of motion.  Cardiovascular: Normal rate, regular rhythm and normal heart sounds.  Exam reveals no gallop and no friction rub.   No murmur heard. Pulmonary/Chest: Effort normal and breath sounds normal.  Lungs clear  Abdominal: Soft. She exhibits no distension. There is tenderness.  Midline scar, well healed, ostomy bag with normal color Diffuse TTP of abdomen  Musculoskeletal: Normal range of motion.  Neurological: She is alert and oriented to person, place, and time.  Skin: Skin is warm and dry.  Psychiatric: She has a normal mood and affect. Judgment normal.  Nursing note and vitals reviewed.    ED Treatments / Results   COORDINATION OF CARE:  8:20 PM Discussed treatment plan with pt at bedside and pt agreed to plan. 10:49 PM spoke with specialist at [facility]. Pt will be admitted.  Labs (all labs ordered are listed, but only abnormal results are displayed) Labs Reviewed  URINE  CULTURE  COMPREHENSIVE METABOLIC PANEL  CBC WITH DIFFERENTIAL/PLATELET  URINALYSIS, ROUTINE W REFLEX MICROSCOPIC (NOT AT [facility])  I-STAT TROPOININ, ED  I-STAT CG4 LACTIC ACID, ED  TYPE AND SCREEN    EKG  EKG Interpretation None       Radiology No results found.  Procedures Procedures (including critical care time)  Medications Ordered in ED Medications  sodium chloride 0.9 % bolus 1,000 mL (not administered)     Initial Impression / Assessment and Plan / ED Course  I have reviewed the triage vital signs and the nursing notes.  Pertinent labs & imaging results that were available during my care of the patient were reviewed by me and considered in my medical decision making (see chart for details).  Clinical Course    Patient is an 82 year old female  presenting with multiple episodes of emesis. Her last episode she said had coffee-ground appearance to it.Patient has ostomy from remote surgery. Patient has mild abdominal tenderness on exam. I'm concerned concerned about bowel obstruction. We'll get labs well.  EKG is nonischemic. Troponin is mildly elevated. Likely this is a troponin leak., Type II.  CT shows small bowel obstruction with possible transition point at hernia. Discussed with general surgery. Will admit to [facility] with Dr. Andrey Campanile following.  No vomiting since arrival to the emergency department.    Final Clinical Impressions(s) / ED Diagnoses   Final diagnoses:  None  I personally performed the services described in this documentation, which was scribed in my presence. The recorded information has been reviewed and is accurate.     New Prescriptions New Prescriptions   No medications on file     Astrid Vides Randall An, MD 08/07/16 2359    Enio Hornback Randall An, MD 08/07/16 2359

## 2016-08-07 NOTE — ED Notes (Signed)
Patient transported to X-ray with RN at bedside. 

## 2016-08-07 NOTE — ED Notes (Signed)
Pt resting quietly. Eyes closed. Rise and fall of chest noted. No pain med given.

## 2016-08-07 NOTE — Plan of Care (Signed)
SBO, transition point within small ventral hernia which is next to her larger hernia.  EDP spoke with gen surg Dr. Andrey CampanileWilson.  Pt not vomiting now so no NGT.  [facility]

## 2016-08-07 NOTE — ED Triage Notes (Addendum)
Per daughter patient has been vomiting since 1430.  Last episode was at 1830 tonight.  Reports emesis as dark in color, coffee ground.  Patient has colostomy.  States that no changes have been noted to output in colostomy since vomiting began.  Reports generalized abdominal pain

## 2016-08-08 ENCOUNTER — Inpatient Hospital Stay (HOSPITAL_COMMUNITY): Payer: Medicare Other

## 2016-08-08 DIAGNOSIS — K460 Unspecified abdominal hernia with obstruction, without gangrene: Secondary | ICD-10-CM | POA: Diagnosis present

## 2016-08-08 DIAGNOSIS — E119 Type 2 diabetes mellitus without complications: Secondary | ICD-10-CM

## 2016-08-08 DIAGNOSIS — I10 Essential (primary) hypertension: Secondary | ICD-10-CM | POA: Diagnosis present

## 2016-08-08 DIAGNOSIS — M329 Systemic lupus erythematosus, unspecified: Secondary | ICD-10-CM | POA: Diagnosis present

## 2016-08-08 LAB — TROPONIN I
TROPONIN I: 0.13 ng/mL — AB (ref <0.03)
Troponin I: 0.18 ng/mL (ref <0.03)
Troponin I: 0.17 ng/mL (ref <0.03)

## 2016-08-08 LAB — URINALYSIS, ROUTINE W REFLEX MICROSCOPIC
Bilirubin Urine: NEGATIVE
GLUCOSE, UA: NEGATIVE mg/dL
HGB URINE DIPSTICK: NEGATIVE
KETONES UR: NEGATIVE mg/dL
LEUKOCYTES UA: NEGATIVE
Nitrite: NEGATIVE
Protein, ur: NEGATIVE mg/dL
Specific Gravity, Urine: 1.033 — ABNORMAL HIGH (ref 1.005–1.030)
pH: 7 (ref 5.0–8.0)

## 2016-08-08 LAB — GLUCOSE, CAPILLARY
GLUCOSE-CAPILLARY: 77 mg/dL (ref 65–99)
GLUCOSE-CAPILLARY: 85 mg/dL (ref 65–99)
GLUCOSE-CAPILLARY: 87 mg/dL (ref 65–99)
Glucose-Capillary: 96 mg/dL (ref 65–99)
Glucose-Capillary: 90 mg/dL (ref 65–99)
Glucose-Capillary: 88 mg/dL (ref 65–99)

## 2016-08-08 MED ORDER — PREDNISONE 5 MG PO TABS
7.5000 mg | ORAL_TABLET | Freq: Every day | ORAL | Status: DC
  Administered 2016-08-08 – 2016-08-11 (×4): 7.5 mg via ORAL
  Filled 2016-08-08 (×4): qty 2

## 2016-08-08 MED ORDER — INSULIN ASPART 100 UNIT/ML ~~LOC~~ SOLN
0.0000 [IU] | SUBCUTANEOUS | Status: DC
  Administered 2016-08-09 (×2): 1 [IU] via SUBCUTANEOUS

## 2016-08-08 MED ORDER — IRBESARTAN 300 MG PO TABS
150.0000 mg | ORAL_TABLET | Freq: Every day | ORAL | Status: DC
  Administered 2016-08-08 – 2016-08-11 (×4): 150 mg via ORAL
  Filled 2016-08-08 (×4): qty 1

## 2016-08-08 MED ORDER — ATENOLOL 12.5 MG HALF TABLET
12.5000 mg | ORAL_TABLET | Freq: Every day | ORAL | Status: DC
Start: 2016-08-08 — End: 2016-08-11
  Administered 2016-08-08 – 2016-08-11 (×3): 12.5 mg via ORAL
  Filled 2016-08-08 (×4): qty 1

## 2016-08-08 MED ORDER — INSULIN ASPART 100 UNIT/ML ~~LOC~~ SOLN: 0.0000 [IU] | SUBCUTANEOUS | Status: DC

## 2016-08-08 MED ORDER — DARIFENACIN HYDROBROMIDE ER 15 MG PO TB24
15.0000 mg | ORAL_TABLET | Freq: Every day | ORAL | Status: DC
  Filled 2016-08-08 (×4): qty 1

## 2016-08-08 MED ORDER — ORAL CARE MOUTH RINSE
15.0000 mL | Freq: Two times a day (BID) | OROMUCOSAL | Status: DC
  Administered 2016-08-08 – 2016-08-10 (×3): 15 mL via OROMUCOSAL

## 2016-08-08 MED ORDER — SODIUM CHLORIDE 0.9 % IV SOLN
INTRAVENOUS | Status: DC
  Administered 2016-08-08: 04:00:00 via INTRAVENOUS
  Administered 2016-08-08: 1000 mL via INTRAVENOUS

## 2016-08-08 MED ORDER — HYDROMORPHONE HCL 1 MG/ML IJ SOLN
0.5000 mg | INTRAMUSCULAR | Status: DC | PRN
  Administered 2016-08-08 – 2016-08-09 (×6): 0.5 mg via INTRAVENOUS
  Filled 2016-08-08 (×6): qty 1

## 2016-08-08 MED ORDER — ADULT MULTIVITAMIN W/MINERALS CH
1.0000 | ORAL_TABLET | Freq: Every day | ORAL | Status: DC
  Administered 2016-08-08 – 2016-08-11 (×4): 1 via ORAL
  Filled 2016-08-08 (×4): qty 1

## 2016-08-08 MED ORDER — DIATRIZOATE MEGLUMINE & SODIUM 66-10 % PO SOLN
90.0000 mL | Freq: Once | ORAL | Status: AC
  Administered 2016-08-08: 90 mL via NASOGASTRIC
  Filled 2016-08-08: qty 90

## 2016-08-08 MED ORDER — ENOXAPARIN SODIUM 40 MG/0.4ML ~~LOC~~ SOLN
40.0000 mg | SUBCUTANEOUS | Status: DC
Start: 2016-08-08 — End: 2016-08-11
  Administered 2016-08-08 – 2016-08-10 (×3): 40 mg via SUBCUTANEOUS
  Filled 2016-08-08 (×3): qty 0.4

## 2016-08-08 MED ORDER — KCL IN DEXTROSE-NACL 20-5-0.45 MEQ/L-%-% IV SOLN
INTRAVENOUS | Status: DC
  Administered 2016-08-08: 1000 mL via INTRAVENOUS
  Administered 2016-08-09: 75 mL/h via INTRAVENOUS
  Filled 2016-08-08 (×4): qty 1000

## 2016-08-08 MED ORDER — PANTOPRAZOLE SODIUM 40 MG PO TBEC
40.0000 mg | DELAYED_RELEASE_TABLET | Freq: Every day | ORAL | Status: DC
  Administered 2016-08-08 – 2016-08-11 (×4): 40 mg via ORAL
  Filled 2016-08-08 (×4): qty 1

## 2016-08-08 MED ORDER — ONDANSETRON HCL 4 MG/2ML IJ SOLN
4.0000 mg | Freq: Four times a day (QID) | INTRAMUSCULAR | Status: DC | PRN
Start: 2016-08-08 — End: 2016-08-11
  Filled 2016-08-08: qty 2

## 2016-08-08 NOTE — H&P (Signed)
History and Physical    Kristen Wallace WUJ:[id number] DOB: 03-30-1926 DOA: 08/07/2016   PCP: [name], MD Chief Complaint:  Chief Complaint  Patient presents with  . Emesis  . Abdominal Pain    HPI: Kristen Wallace is a 82 y.o. female with medical history significant of Prior abdominal surgeries including colostomy.  Patient presents to ED at [facility] with c/o N/V abdominal pain.  Symptoms onset suddenly at 230 AM on 9/10, have been persistent since onset.  Vomiting was NBNB.  Abdominal pain is generalized.  Nothing makes symptoms better or worse.  ED Course: Has SBO with transition point in a small ventral hernia which is next to her large stomal hernia.  Review of Systems: As per HPI otherwise 10 point review of systems negative.    Past Medical History:  Diagnosis Date  . Back pain, chronic   . Blood transfusion without reported diagnosis   . Chronic renal insufficiency   . Diabetes mellitus without complication (HCC)   . Hypertension   . Lupus (HCC)   . Macular degeneration   . Spinal stenosis     Past Surgical History:  Procedure Laterality Date  . APPENDECTOMY    . CHOLECYSTECTOMY    . COLOSTOMY    . EYE SURGERY    . KYPHOPLASTY N/A 03/03/2016   Procedure: Lumbar three kyphoplasty ;  Surgeon: Coletta Memos, MD;  Location: [facility];  Service: Neurosurgery;  Laterality: N/A;  . TONSILLECTOMY       reports that she has never smoked. She has never used smokeless tobacco. She reports that she does not drink alcohol or use drugs.  Allergies  Allergen Reactions  . Betadine [Povidone Iodine] Itching and Other (See Comments)    Burns eyes    History reviewed. No pertinent family history.  No recent illness or sick contacts.   Prior to Admission medications   Medication Sig Start Date End Date Taking? Authorizing Provider  furosemide (LASIX) 40 MG tablet Take 40 mg by mouth daily.   Yes Historical Provider, MD  atenolol (TENORMIN) 25 MG tablet Take 12.5 mg by  mouth daily.    Historical Provider, MD  citalopram (CELEXA) 10 MG tablet Take 10 mg by mouth every morning. 02/15/16   Historical Provider, MD  darifenacin (ENABLEX) 15 MG 24 hr tablet Take 15 mg by mouth daily.    Historical Provider, MD  hydrochlorothiazide (HYDRODIURIL) 25 MG tablet Take 12.5 mg by mouth every other day.    Historical Provider, MD  Hypromellose (ARTIFICIAL TEARS OP) Place 1 drop into both eyes 3 (three) times daily as needed (dry eyes).    Historical Provider, MD  lidocaine (LIDODERM) 5 % APPLY 1 PATCH TO INTACT SKIN REMOVE AFTER 12 HOURS ONCE A DAY EXTERNALLY 02/10/16   Historical Provider, MD  Multiple Vitamin (MULTIVITAMIN) capsule Take 1 capsule by mouth daily.    Historical Provider, MD  Multiple Vitamins-Minerals (ICAPS PO) Take 1 capsule by mouth 2 (two) times daily.    Historical Provider, MD  omeprazole (PRILOSEC) 20 MG capsule Take 20 mg by mouth daily.    Historical Provider, MD  oxyCODONE-acetaminophen (PERCOCET) 10-325 MG tablet Take 1 tablet by mouth every 4 (four) hours as needed for pain.  02/15/16   Historical Provider, MD  predniSONE (DELTASONE) 5 MG tablet Take 7.5 mg by mouth daily. 02/12/16   Historical Provider, MD  Probiotic Product (PROBIOTIC PO) Take 1 capsule by mouth daily.    Historical Provider, MD  telmisartan (MICARDIS) 40 MG  tablet Take 40 mg by mouth daily.    Historical Provider, MD    Physical Exam: Vitals:   08/07/16 2330 08/07/16 2345 08/08/16 0046 08/08/16 0051  BP: 120/60 125/59  133/75  Pulse:    98  Resp: 18 20  19   Temp:    99.6 F (37.6 C)  TempSrc:    Oral  SpO2:    95%  Weight:   63.3 kg (139 lb 9.6 oz)   Height:   5\' 5"  (1.651 m)       Constitutional: NAD, calm, comfortable Eyes: PERRL, lids and conjunctivae normal ENMT: Mucous membranes are moist. Posterior pharynx clear of any exudate or lesions.Normal dentition.  Neck: normal, supple, no masses, no thyromegaly Respiratory: clear to auscultation bilaterally, no  wheezing, no crackles. Normal respiratory effort. No accessory muscle use.  Cardiovascular: Regular rate and rhythm, no murmurs / rubs / gallops. No extremity edema. 2+ pedal pulses. No carotid bruits.  Abdomen: no tenderness, no masses palpated. No hepatosplenomegaly. Bowel sounds positive.  Musculoskeletal: no clubbing / cyanosis. No joint deformity upper and lower extremities. Good ROM, no contractures. Normal muscle tone.  Skin: no rashes, lesions, ulcers. No induration Neurologic: CN 2-12 grossly intact. Sensation intact, DTR normal. Strength 5/5 in all 4.  Psychiatric: Normal judgment and insight. Alert and oriented x 3. Normal mood.    Labs on Admission: I have personally reviewed following labs and imaging studies  CBC:  Recent Labs Lab 08/07/16 2030  WBC 12.1*  NEUTROABS 9.8*  HGB 13.2  HCT 39.4  MCV 103.1*  PLT 162   Basic Metabolic Panel:  Recent Labs Lab 08/07/16 2030  NA 137  K 3.4*  CL 97*  CO2 31  GLUCOSE 126*  BUN 25*  CREATININE 0.99  CALCIUM 9.0   GFR: Estimated Creatinine Clearance: 35.3 mL/min (by C-G formula based on SCr of 0.99 mg/dL). Liver Function Tests:  Recent Labs Lab 08/07/16 2030  AST 29  ALT 15  ALKPHOS 58  BILITOT 1.8*  PROT 5.8*  ALBUMIN 3.5   No results for input(s): LIPASE, AMYLASE in the last 168 hours. No results for input(s): AMMONIA in the last 168 hours. Coagulation Profile: No results for input(s): INR, PROTIME in the last 168 hours. Cardiac Enzymes:  Recent Labs Lab 08/07/16 2030 08/08/16 0000  TROPONINI 0.09* 0.13*   BNP (last 3 results) No results for input(s): PROBNP in the last 8760 hours. HbA1C: No results for input(s): HGBA1C in the last 72 hours. CBG: No results for input(s): GLUCAP in the last 168 hours. Lipid Profile: No results for input(s): CHOL, HDL, LDLCALC, TRIG, CHOLHDL, LDLDIRECT in the last 72 hours. Thyroid Function Tests: No results for input(s): TSH, T4TOTAL, FREET4, T3FREE, THYROIDAB  in the last 72 hours. Anemia Panel: No results for input(s): VITAMINB12, FOLATE, FERRITIN, TIBC, IRON, RETICCTPCT in the last 72 hours. Urine analysis:    Component Value Date/Time   COLORURINE YELLOW 03/02/2016 1717   APPEARANCEUR CLEAR 03/02/2016 1717   LABSPEC 1.013 03/02/2016 1717   PHURINE 6.0 03/02/2016 1717   GLUCOSEU NEGATIVE 03/02/2016 1717   HGBUR NEGATIVE 03/02/2016 1717   BILIRUBINUR NEGATIVE 03/02/2016 1717   KETONESUR NEGATIVE 03/02/2016 1717   PROTEINUR NEGATIVE 03/02/2016 1717   UROBILINOGEN 1.0 02/03/2013 0030   NITRITE NEGATIVE 03/02/2016 1717   LEUKOCYTESUR LARGE (A) 03/02/2016 1717   Sepsis Labs: @LABRCNTIP (procalcitonin:4,lacticidven:4) )No results found for this or any previous visit (from the past 240 hour(s)).   Radiological Exams on Admission: Dg Chest 2 View  Result Date: 08/07/2016 CLINICAL DATA:  Chest pain and elevated troponin EXAM: CHEST  2 VIEW COMPARISON:  02/02/2013 FINDINGS: Eventration of the right diaphragm. Normal heart size and mediastinal contours. There is no edema, consolidation, effusion, or pneumothorax. IMPRESSION: No evidence of acute cardiopulmonary disease. Electronically Signed   By: Marnee Spring M.D.   On: 08/07/2016 22:01   Ct Abdomen Pelvis W Contrast  Result Date: 08/07/2016 CLINICAL DATA:  Sudden onset emesis at 02:30 this morning. EXAM: CT ABDOMEN AND PELVIS WITH CONTRAST TECHNIQUE: Multidetector CT imaging of the abdomen and pelvis was performed using the standard protocol following bolus administration of intravenous contrast. CONTRAST:  ISOVUE-300 IOPAMIDOL (ISOVUE-300) INJECTION 61% COMPARISON:  None. FINDINGS: Lower chest: No significant abnormality Hepatobiliary: Prior cholecystectomy. Pneumobilia, perhaps related to prior biliary procedure. No focal liver lesions. No significant bile duct dilatation. Pancreas: The pancreas appears mildly atrophic. No focal pancreatic parenchymal lesion is evident. No significant  pancreatic duct dilatation. No peripancreatic inflammatory changes. Spleen: Normal Adrenals/Urinary Tract: The adrenals and kidneys are normal in appearance. There is no urinary calculus evident. There is no hydronephrosis or ureteral dilatation. Collecting systems and ureters appear unremarkable. Stomach/Bowel: There is dilatation of stomach and proximal-mid small bowel with abrupt transition to mid-distal small bowel. Transition point is poorly defined but probably within a small ventral hernia which is located immediately to the right of a large peristomal hernia. The peristomal hernia also contains a portion of small bowel proximal to the obstruction, and a segment of unobstructed colon. There are unremarkable appearances of the descending colon colostomy. No extraluminal air.  No pneumatosis. Vascular/Lymphatic: The abdominal aorta is normal in caliber. There is mild atherosclerotic calcification. There is no adenopathy in the abdomen or pelvis. Reproductive: Prior hysterectomy.  No adnexal abnormalities. Other: No ascites. Musculoskeletal: Moderate L2 compression and sclerosis, new/worsened from 03/25/2016. Unchanged L3 compression and augmentation. No suspicious skeletal lesions. IMPRESSION: 1. Small bowel obstruction with transition point in the proximal ileum. The transition is probably within a small ventral hernia which is located immediately to the right of the large peristomal hernia. 2. New/worsened compression of L2.  No suspicious skeletal lesions. 3. Pneumobilia, likely related to prior biliary procedure. Electronically Signed   By: Ellery Plunk M.D.   On: 08/07/2016 22:16    EKG: Independently reviewed.  Assessment/Plan Principal Problem:   Hernia with obstruction Active Problems:   SBO (small bowel obstruction) (HCC)   DM2 (diabetes mellitus, type 2) (HCC)   HTN (hypertension)   SLE (systemic lupus erythematosus) (HCC)    1. Hernia with obstruction - 1. Dr. Andrey Campanile  consulted 2. NPO 3. IVF 4. zofran and dilaudid PRN 5. If vomiting returns then will need NGT placed 2. DM2 - 1. Sensitive scale SSI q4h while NPO 3. HTN - continue home meds 4. SLE - continue daily prednisone   DVT prophylaxis: Lovenox Code Status: Full Family Communication: Daughter at bedside Consults called: Dr. Andrey Campanile consulted and will see patient later Admission status: Admit to inpatient   Hillary Bow DO [facility] Pager [phone] from 7PM-7AM  If 7AM-7PM, please contact the day physician for the patient [email or web address] Password [email or web address]  08/08/2016, 2:40 AM

## 2016-08-08 NOTE — Progress Notes (Signed)
CRITICAL VALUE ALERT  Critical value received:  Troponin=0.18  Date of notification:  08/08/2016  Time of notification:  11:21  Critical value read back:Yes.    Nurse who received alert:  Lurena NidaGreta Etheline Geppert  MD notified (1st page):  Dr. Gwenlyn PerkingMadera  Time of first page:  11:24  MD notified (2nd page): Dr. Gwenlyn PerkingMadera  Time of second page: 11:44  Responding MD:  Dr. Gwenlyn PerkingMadera  Time MD responded:  12:00

## 2016-08-08 NOTE — Consult Note (Signed)
Reason for Consult:psbo Referring Physician: dr gardner  Kristen Wallace is an 82 y.o. female.  HPI: 82 year old Caucasian female with chronic renal insufficiency, diabetes mellitus, lupus on chronic steroids, hypertension and a prolonged history of colostomy awoke around 2:30 AM on Sunday morning with nausea, vomiting and abdominal pain. The pain was in her mid abdomen. She continued to have intermittent bouts of vomiting every few hours. She denies any fevers or chills. She continued to have stool in her colostomy bag and air. Because of the ongoing vomiting and abdominal discomfort she went to [facility] and was evaluated and was found to have a partial small bowel obstruction. The family requested transfer to [facility].  The daughter relates that the patient has had intermittent nausea off and on for years. She had a bowel obstruction around 18 years ago due to diverticular disease. The daughter explains that she underwent surgery because of this bowel obstruction and underwent colostomy. They do not believe she had a colonic resection. The patient preferred to have a colostomy because she had apparently some fecal incontinence issues due to weak pelvic muscles from multiple vaginal deliveries.  Other abdominal surgery history includes appendectomy and cholecystectomy. The daughter states that during her cholecystectomy they clipped both the right and left hepatic ducts and she had to undergo hepaticojejunostomy.  The patient lives independently and uses a walker. She takes prednisone daily for lupus.  She has had ongoing vomiting since admission every few hours.  Past Medical History:  Diagnosis Date  . Back pain, chronic   . Blood transfusion without reported diagnosis   . Chronic renal insufficiency   . Diabetes mellitus without complication ([place])   . Hypertension   . Lupus ([place])   . Macular degeneration   . Spinal stenosis     Past Surgical History:  Procedure Laterality  Date  . APPENDECTOMY    . CHOLECYSTECTOMY    . COLOSTOMY    . EYE SURGERY    . KYPHOPLASTY N/A 03/03/2016   Procedure: Lumbar three kyphoplasty ;  Surgeon: Ashok Pall, MD;  Location: [facility];  Service: Neurosurgery;  Laterality: N/A;  . TONSILLECTOMY      History reviewed. No pertinent family history.  Social History:  reports that she has never smoked. She has never used smokeless tobacco. She reports that she does not drink alcohol or use drugs.  Allergies:  Allergies  Allergen Reactions  . Betadine [Povidone Iodine] Itching and Other (See Comments)    Burns eyes    Medications: I have reviewed the patient's current medications.  Results for orders placed or performed during the hospital encounter of 08/07/16 (from the past 48 hour(s))  Comprehensive metabolic panel     Status: Abnormal   Collection Time: 08/07/16  8:30 PM  Result Value Ref Range   Sodium 137 135 - 145 mmol/L   Potassium 3.4 (L) 3.5 - 5.1 mmol/L   Chloride 97 (L) 101 - 111 mmol/L   CO2 31 22 - 32 mmol/L   Glucose, Bld 126 (H) 65 - 99 mg/dL   BUN 25 (H) 6 - 20 mg/dL   Creatinine, Ser 0.99 0.44 - 1.00 mg/dL   Calcium 9.0 8.9 - 10.3 mg/dL   Total Protein 5.8 (L) 6.5 - 8.1 g/dL   Albumin 3.5 3.5 - 5.0 g/dL   AST 29 15 - 41 U/L   ALT 15 14 - 54 U/L   Alkaline Phosphatase 58 38 - 126 U/L   Total Bilirubin  1.8 (H) 0.3 - 1.2 mg/dL   GFR calc non Af Amer 49 (L) >60 mL/min   GFR calc Af Amer 57 (L) >60 mL/min    Comment: (NOTE) The eGFR has been calculated using the CKD EPI equation. This calculation has not been validated in all clinical situations. eGFR's persistently <60 mL/min signify possible Chronic Kidney Disease.    Anion gap 9 5 - 15  CBC with Differential/Platelet     Status: Abnormal   Collection Time: 08/07/16  8:30 PM  Result Value Ref Range   WBC 12.1 (H) 4.0 - 10.5 K/uL   RBC 3.82 (L) 3.87 - 5.11 MIL/uL   Hemoglobin 13.2 12.0 - 15.0 g/dL   HCT 39.4 36.0 - 46.0 %   MCV 103.1 (H)  78.0 - 100.0 fL   MCH 34.6 (H) 26.0 - 34.0 pg   MCHC 33.5 30.0 - 36.0 g/dL   RDW 12.0 11.5 - 15.5 %   Platelets 162 150 - 400 K/uL   Neutrophils Relative % 81 %   Neutro Abs 9.8 (H) 1.7 - 7.7 K/uL   Lymphocytes Relative 10 %   Lymphs Abs 1.2 0.7 - 4.0 K/uL   Monocytes Relative 9 %   Monocytes Absolute 1.1 (H) 0.1 - 1.0 K/uL   Eosinophils Relative 0 %   Eosinophils Absolute 0.0 0.0 - 0.7 K/uL   Basophils Relative 0 %   Basophils Absolute 0.0 0.0 - 0.1 K/uL  Troponin I     Status: Abnormal   Collection Time: 08/07/16  8:30 PM  Result Value Ref Range   Troponin I 0.09 (HH) <0.03 ng/mL    Comment: CRITICAL RESULT CALLED TO, READ BACK BY AND VERIFIED WITH: Maynard,C @ 2114 ON 08/07/2016 BY Punjtan,G   I-Stat CG4 Lactic Acid, ED     Status: None   Collection Time: 08/07/16  8:33 PM  Result Value Ref Range   Lactic Acid, Venous 1.30 0.5 - 1.9 mmol/L  Troponin I     Status: Abnormal   Collection Time: 08/08/16 12:00 AM  Result Value Ref Range   Troponin I 0.13 (HH) <0.03 ng/mL    Comment: CRITICAL RESULT CALLED TO, READ BACK BY AND VERIFIED WITH: MAYNARD,C,RN @ 0038 08/08/16 BY GWYN,P   Urinalysis, Routine w reflex microscopic (not at [facility])     Status: Abnormal   Collection Time: 08/08/16  2:28 AM  Result Value Ref Range   Color, Urine YELLOW YELLOW   APPearance CLEAR CLEAR   Specific Gravity, Urine 1.033 (H) 1.005 - 1.030   pH 7.0 5.0 - 8.0   Glucose, UA NEGATIVE NEGATIVE mg/dL   Hgb urine dipstick NEGATIVE NEGATIVE   Bilirubin Urine NEGATIVE NEGATIVE   Ketones, ur NEGATIVE NEGATIVE mg/dL   Protein, ur NEGATIVE NEGATIVE mg/dL   Nitrite NEGATIVE NEGATIVE   Leukocytes, UA NEGATIVE NEGATIVE    Comment: MICROSCOPIC NOT DONE ON URINES WITH NEGATIVE PROTEIN, BLOOD, LEUKOCYTES, NITRITE, OR GLUCOSE <1000 mg/dL.    Dg Chest 2 View  Result Date: 08/07/2016 CLINICAL DATA:  Chest pain and elevated troponin EXAM: CHEST  2 VIEW COMPARISON:  02/02/2013 FINDINGS: Eventration of the right  diaphragm. Normal heart size and mediastinal contours. There is no edema, consolidation, effusion, or pneumothorax. IMPRESSION: No evidence of acute cardiopulmonary disease. Electronically Signed   By: Monte Fantasia M.D.   On: 08/07/2016 22:01   Ct Abdomen Pelvis W Contrast  Result Date: 08/07/2016 CLINICAL DATA:  Sudden onset emesis at 02:30 this morning. EXAM: CT ABDOMEN AND PELVIS  WITH CONTRAST TECHNIQUE: Multidetector CT imaging of the abdomen and pelvis was performed using the standard protocol following bolus administration of intravenous contrast. CONTRAST:  126m ISOVUE-300 IOPAMIDOL (ISOVUE-300) INJECTION 61% COMPARISON:  None. FINDINGS: Lower chest: No significant abnormality Hepatobiliary: Prior cholecystectomy. Pneumobilia, perhaps related to prior biliary procedure. No focal liver lesions. No significant bile duct dilatation. Pancreas: The pancreas appears mildly atrophic. No focal pancreatic parenchymal lesion is evident. No significant pancreatic duct dilatation. No peripancreatic inflammatory changes. Spleen: Normal Adrenals/Urinary Tract: The adrenals and kidneys are normal in appearance. There is no urinary calculus evident. There is no hydronephrosis or ureteral dilatation. Collecting systems and ureters appear unremarkable. Stomach/Bowel: There is dilatation of stomach and proximal-mid small bowel with abrupt transition to mid-distal small bowel. Transition point is poorly defined but probably within a small ventral hernia which is located immediately to the right of a large peristomal hernia. The peristomal hernia also contains a portion of small bowel proximal to the obstruction, and a segment of unobstructed colon. There are unremarkable appearances of the descending colon colostomy. No extraluminal air.  No pneumatosis. Vascular/Lymphatic: The abdominal aorta is normal in caliber. There is mild atherosclerotic calcification. There is no adenopathy in the abdomen or pelvis. Reproductive:  Prior hysterectomy.  No adnexal abnormalities. Other: No ascites. Musculoskeletal: Moderate L2 compression and sclerosis, new/worsened from 03/25/2016. Unchanged L3 compression and augmentation. No suspicious skeletal lesions. IMPRESSION: 1. Small bowel obstruction with transition point in the proximal ileum. The transition is probably within a small ventral hernia which is located immediately to the right of the large peristomal hernia. 2. New/worsened compression of L2.  No suspicious skeletal lesions. 3. Pneumobilia, likely related to prior biliary procedure. Electronically Signed   By: DAndreas NewportM.D.   On: 08/07/2016 22:16    Review of Systems  Constitutional: Negative for weight loss.  HENT: Negative for nosebleeds.   Eyes: Negative for blurred vision.  Respiratory: Negative for shortness of breath.   Cardiovascular: Negative for chest pain, palpitations, orthopnea and PND.       Denies DOE  Gastrointestinal: Positive for abdominal pain, nausea and vomiting.  Genitourinary: Negative for dysuria and hematuria.  Musculoskeletal: Positive for back pain (chronic).       Uses a walker  Skin: Negative for itching and rash.  Neurological: Negative for dizziness, focal weakness, seizures, loss of consciousness and headaches.       Denies TIAs, amaurosis fugax  Endo/Heme/Allergies: Does not bruise/bleed easily.  Psychiatric/Behavioral: The patient is not nervous/anxious.    Blood pressure 124/63, pulse 96, temperature 100 F (37.8 C), temperature source Oral, resp. rate 18, height '5\' 5"'$  (1.651 m), weight 63.3 kg (139 lb 9.6 oz), SpO2 93 %. Physical Exam  Vitals reviewed. Constitutional: She is oriented to person, place, and time. She appears well-developed and well-nourished. No distress.  HENT:  Head: Normocephalic and atraumatic.  Right Ear: External ear normal.  Left Ear: External ear normal.  Eyes: Conjunctivae are normal. No scleral icterus.  Neck: Normal range of motion. Neck  supple. No tracheal deviation present.  Cardiovascular: Normal rate and normal heart sounds.   Respiratory: Effort normal and breath sounds normal. No stridor. No respiratory distress. She has no wheezes.  GI: Soft. There is no tenderness. There is no rebound.    Mid abdominal colostomy with some stool in the bag. Her abdomen is soft. Mildly tender. Obvious parastomal hernia which is soft and mostly reducible. Appears to also have a hernia to the right of the colostomy.  Difficult to tell if reducible but it is soft and does not appear that tender. Seems like she might have a Swiss cheese defect in her midline and parastomal area  Musculoskeletal: She exhibits no edema or tenderness.  Lymphadenopathy:    She has no cervical adenopathy.  Neurological: She is alert and oriented to person, place, and time. She exhibits normal muscle tone.  Skin: Skin is warm and dry. No rash noted. She is not diaphoretic. No erythema. No pallor.  Psychiatric: She has a normal mood and affect. Her behavior is normal. Judgment and thought content normal.    Assessment/Plan: Partial small bowel obstruction Peristomal hernia Incisional hernia Lupus on chronic steroids Hypertension Chronic back pain Elevated troponin Diabetes mellitus Mild dehydration  I reviewed her CT scan. It appears that she has both a parastomal hernia as well as an incisional hernia. The transition point may be in or near the incisional hernia. She is nontoxic appearing. She is afebrile. Her abdomen is soft and fairly nontender. There is no pneumatosis or bowel wall thickening. I believe the elevated wbc is more reflective of dehydration given the fact that her hemoglobin and hematocrit are elevated compared to her previous CBCs in the spring of this year.  I recommended starting with nonoperative management and instituting our small bowel obstruction protocol. I explained to the patient and her daughter put that involved namely placement of  a nasogastric tube and serial abdominal x-rays. The daughter was in complete agreement with starting with nonsurgical management. We discussed the possibility of nonoperative management not working but given her age and comorbidities I believe we should try nonsurgical management first.  Defer troponin w/u to [facility] NPO - not sure if she will tolerate oral meds but can try Insert NG tube, start SBO protocol  Leighton Ruff. Redmond Pulling, MD, FACS General, Bariatric, & Minimally Invasive Surgery [facility], [place]   [facility] M 08/08/2016, 5:23 AM

## 2016-08-08 NOTE — Progress Notes (Signed)
Per MD verbal order. Insulin must be hold if CBG<150. Will continue to monitor.

## 2016-08-08 NOTE — Progress Notes (Signed)
Patient seen and examined. Admitted after midnight secondary to SBO/partial SBO due to intestinal transition point in or near incisional hernia. Patient denies CP and SOB. Endorses some discomfort in her abdomen and has decreased BS. There is hard stool in her colostomy bag. Patient had elevated troponin, but not acute EKG or telemetry changes for ischemia. Pleaser efer to H&P written by Dr. Julian ReilGardner for further info/details on admission   Plan: -conservative management for SBO -monitor on telemetry and follow 2-D echo -will follow general surgery recommendations   Kristen Wallace, Kristen Wallace [phone]

## 2016-08-09 ENCOUNTER — Inpatient Hospital Stay (HOSPITAL_COMMUNITY): Payer: Medicare Other

## 2016-08-09 ENCOUNTER — Encounter (HOSPITAL_COMMUNITY): Payer: Self-pay | Admitting: General Practice

## 2016-08-09 DIAGNOSIS — R079 Chest pain, unspecified: Secondary | ICD-10-CM

## 2016-08-09 DIAGNOSIS — I10 Essential (primary) hypertension: Secondary | ICD-10-CM

## 2016-08-09 DIAGNOSIS — G894 Chronic pain syndrome: Secondary | ICD-10-CM

## 2016-08-09 DIAGNOSIS — E119 Type 2 diabetes mellitus without complications: Secondary | ICD-10-CM

## 2016-08-09 DIAGNOSIS — K5669 Other intestinal obstruction: Secondary | ICD-10-CM

## 2016-08-09 DIAGNOSIS — M329 Systemic lupus erythematosus, unspecified: Secondary | ICD-10-CM

## 2016-08-09 LAB — BASIC METABOLIC PANEL
Anion gap: 7 (ref 5–15)
BUN: 18 mg/dL (ref 6–20)
CALCIUM: 8.1 mg/dL — AB (ref 8.9–10.3)
CO2: 28 mmol/L (ref 22–32)
CREATININE: 1 mg/dL (ref 0.44–1.00)
Chloride: 103 mmol/L (ref 101–111)
GFR calc Af Amer: 57 mL/min — ABNORMAL LOW (ref >60)
GFR, EST NON AFRICAN AMERICAN: 49 mL/min — AB (ref >60)
GLUCOSE: 144 mg/dL — AB (ref 65–99)
Potassium: 3.1 mmol/L — ABNORMAL LOW (ref 3.5–5.1)
Sodium: 138 mmol/L (ref 135–145)

## 2016-08-09 LAB — URINE CULTURE: Culture: NO GROWTH

## 2016-08-09 LAB — CBC
HEMATOCRIT: 33.3 % — AB (ref 36.0–46.0)
Hemoglobin: 10.7 g/dL — ABNORMAL LOW (ref 12.0–15.0)
MCH: 33.4 pg (ref 26.0–34.0)
MCHC: 32.1 g/dL (ref 30.0–36.0)
MCV: 104.1 fL — AB (ref 78.0–100.0)
PLATELETS: 149 10*3/uL — AB (ref 150–400)
RBC: 3.2 MIL/uL — ABNORMAL LOW (ref 3.87–5.11)
RDW: 12.5 % (ref 11.5–15.5)
WBC: 8.5 10*3/uL (ref 4.0–10.5)

## 2016-08-09 LAB — GLUCOSE, CAPILLARY
GLUCOSE-CAPILLARY: 96 mg/dL (ref 65–99)
GLUCOSE-CAPILLARY: 137 mg/dL — AB (ref 65–99)
Glucose-Capillary: 96 mg/dL (ref 65–99)
Glucose-Capillary: 141 mg/dL — ABNORMAL HIGH (ref 65–99)

## 2016-08-09 LAB — ECHOCARDIOGRAM COMPLETE
Height: 65 in
WEIGHTICAEL: 2233.6 [oz_av]

## 2016-08-09 LAB — MAGNESIUM: Magnesium: 1.7 mg/dL (ref 1.7–2.4)

## 2016-08-09 MED ORDER — OXYCODONE-ACETAMINOPHEN 10-325 MG PO TABS: 1.0000 | ORAL_TABLET | ORAL | Status: DC | PRN

## 2016-08-09 MED ORDER — SODIUM CHLORIDE 0.9 % IV SOLN
INTRAVENOUS | Status: DC
  Administered 2016-08-09: 19:00:00 via INTRAVENOUS

## 2016-08-09 MED ORDER — OXYCODONE-ACETAMINOPHEN 5-325 MG PO TABS
1.0000 | ORAL_TABLET | ORAL | Status: DC | PRN
  Administered 2016-08-09 – 2016-08-11 (×7): 1 via ORAL
  Filled 2016-08-09 (×8): qty 1

## 2016-08-09 MED ORDER — LIDOCAINE 5 % EX PTCH
1.0000 | MEDICATED_PATCH | CUTANEOUS | Status: DC
  Administered 2016-08-09 – 2016-08-10 (×2): 1 via TRANSDERMAL
  Filled 2016-08-09 (×2): qty 1

## 2016-08-09 MED ORDER — WHITE PETROLATUM GEL
Status: AC
  Filled 2016-08-09: qty 1

## 2016-08-09 MED ORDER — DOCUSATE SODIUM 50 MG PO CAPS
50.0000 mg | ORAL_CAPSULE | Freq: Two times a day (BID) | ORAL | Status: DC
  Administered 2016-08-09 – 2016-08-11 (×5): 50 mg via ORAL
  Filled 2016-08-09 (×6): qty 1

## 2016-08-09 MED ORDER — OXYCODONE HCL 5 MG PO TABS
5.0000 mg | ORAL_TABLET | ORAL | Status: DC | PRN
  Administered 2016-08-09 – 2016-08-11 (×8): 5 mg via ORAL
  Filled 2016-08-09 (×9): qty 1

## 2016-08-09 MED ORDER — SODIUM CHLORIDE 0.9 % IV BOLUS (SEPSIS)
250.0000 mL | Freq: Once | INTRAVENOUS | Status: AC
  Administered 2016-08-09: 250 mL via INTRAVENOUS

## 2016-08-09 MED ORDER — POLYETHYLENE GLYCOL 3350 17 G PO PACK
17.0000 g | PACK | Freq: Every day | ORAL | Status: DC
  Administered 2016-08-09 – 2016-08-11 (×2): 17 g via ORAL
  Filled 2016-08-09 (×5): qty 1

## 2016-08-09 MED ORDER — LIDOCAINE 5 % EX PTCH
1.0000 | MEDICATED_PATCH | CUTANEOUS | Status: DC
  Administered 2016-08-09 – 2016-08-11 (×3): 1 via TRANSDERMAL
  Filled 2016-08-09 (×3): qty 1

## 2016-08-09 NOTE — Progress Notes (Signed)
Brief Nutrition Note  Pt identified on screening for MST. Spoke with pt and daughter at bedside. Pt reports intentional weight loss of 80# over several years. Pt desired to lose weight to increase mobility due to spinal stenosis. Pt reports possible unintentional recent weight loss but is unsure of the amount and associated timeframe. Pt estimates an 18# weight loss over several months.  Daughter stated that the current plan is to advance the pt to a full liquid diet at dinner and a soft diet at breakfast tomorrow.  Pt reports good appetite PTA and is eager to advance her diet. Pt consumed most of her clear liquid lunch meal without any nausea or vomiting.  NFPE completed. No evidence of muscle depletion, fat depletion, or edema.  Labs and medications reviewed.  No nutrition interventions warranted at this time. Please consult RD if any future nutrition needs arise.  Rosemarie AxKate Yusuf Yu Dietetic Intern Pager Number: [phone]

## 2016-08-09 NOTE — Progress Notes (Signed)
[facility] PROGRESS NOTE  Kristen Wallace ZOX:[id number] DOB: 02-10-1926 DOA: 08/07/2016 PCP: [name], MD  Interim summary and HPI 82 y.o. female with medical history significant of Prior abdominal surgeries including colostomy.  Patient presents to ED at [facility] with c/o N/V abdominal pain.  Symptoms onset suddenly at 230 AM on 9/10, have been persistent since onset.  Vomiting was NBNB.  Abdominal pain is generalized.  Nothing makes symptoms better or worse.  CT scan with contrast in ED demonstrated SBO with transition point in a small ventral hernia which is next to her large stomal hernia.  Assessment/Plan: 1-abd pain: due to incisional hernia obstruction -improving with conservative management  -patient NGT removed today as per surgery rec's -repeat x-ray showing resolution and contrast in the colon now -will continue advancing diet slowly -start bowel regimen  -continue PRN antiemetics and analgesics   2-Diabetes Mellitus: -will use SSI -continue holding oral hypoglycemic regimen for now  3-elevated troponin on admission  -most likely due to demand ischemia -no acute changes on EKG or telemetry  -2-D echo reassuring  -patient denies CP and/or SOB -will d/c telemetry   4-chronic pain: affecting lower back, right shoulder and bilat knees -will resume home analgesic regimen  -restart Lidoderm patch  5-HTN: -continue home antihypertensive regimen  -BP is fairly well control  6-Lupus: -continue chronic steroids   7-GERD/GI protection  -continue PPI      Code Status: Full Family Communication: daughter at bedside  Disposition Plan: will continue advancing diet slowly; initiate bowel regimen and maintain adequate hydration. Will resume home medication regimen for her chronic pain.   Consultants:  General surgery   Procedures:  See below for x-ray reports   2-D echo: - Left ventricle: The cavity size was normal. There was moderate   concentric  hypertrophy. Systolic function was normal. The   estimated ejection fraction was in the range of 55% to 60%. Wall   motion was normal; there were no regional wall motion   abnormalities. Doppler parameters are consistent with both   elevated ventricular end-diastolic filling pressure and elevated   left atrial filling pressure. - Left atrium: The atrium was moderately dilated. - Atrial septum: No defect or patent foramen ovale was identified.  Antibiotics:  None   HPI/Subjective: No CP, no SOB, reports no nausea, no vomiting and currently no abd pain. Tolerated CLd. NGT removed on 08/09/16 in the morning.  Objective: Vitals:   08/09/16 0907 08/09/16 1554  BP: 122/60 (!) 107/47  Pulse:  (!) 58  Resp:  19  Temp:  98.2 F (36.8 C)    Intake/Output Summary (Last 24 hours) at 08/09/16 1806 Last data filed at 08/09/16 0800  Gross per 24 hour  Intake          1081.25 ml  Output              550 ml  Net           531.25 ml   Filed Weights   08/07/16 1937 08/08/16 0046  Weight: 65.8 kg (145 lb) 63.3 kg (139 lb 9.6 oz)    Exam:   General:  Afebrile, denies nausea and vomiting. Patient NGT removed and diet advance. There is gas and stools in her colostomy bag. denies CP and SOB.  Cardiovascular: S1 and S2, no JVD, no rubs or gallops  Respiratory: CTA bilaterally  Abdomen: soft, positive BS, colostomy in left side (with stools inside); patient reports mild soreness on deep palpation, but no frank  pain.  Musculoskeletal: no edema or cyanosis    Data Reviewed: Basic Metabolic Panel:  Recent Labs Lab 08/07/16 2030 08/09/16 1009  NA 137 138  K 3.4* 3.1*  CL 97* 103  CO2 31 28  GLUCOSE 126* 144*  BUN 25* 18  CREATININE 0.99 1.00  CALCIUM 9.0 8.1*  MG  --  1.7   Liver Function Tests:  Recent Labs Lab 08/07/16 2030  AST 29  ALT 15  ALKPHOS 58  BILITOT 1.8*  PROT 5.8*  ALBUMIN 3.5   CBC:  Recent Labs Lab 08/07/16 2030 08/09/16 1009  WBC 12.1* 8.5   NEUTROABS 9.8*  --   HGB 13.2 10.7*  HCT 39.4 33.3*  MCV 103.1* 104.1*  PLT 162 149*   Cardiac Enzymes:  Recent Labs Lab 08/07/16 2030 08/08/16 0000 08/08/16 0947 08/08/16 1655  TROPONINI 0.09* 0.13* 0.18* 0.17*   CBG:  Recent Labs Lab 08/08/16 1959 08/08/16 2343 08/09/16 0446 08/09/16 0825 08/09/16 1136  GLUCAP 88 85 96 96 141*    Recent Results (from the past 240 hour(s))  Urine culture     Status: None   Collection Time: 08/08/16  2:28 AM  Result Value Ref Range Status   Specimen Description URINE, RANDOM  Final   Special Requests NONE  Final   Culture NO GROWTH  Final   Report Status 08/09/2016 FINAL  Final     Studies: Dg Chest 2 View  Result Date: 08/07/2016 CLINICAL DATA:  Chest pain and elevated troponin EXAM: CHEST  2 VIEW COMPARISON:  02/02/2013 FINDINGS: Eventration of the right diaphragm. Normal heart size and mediastinal contours. There is no edema, consolidation, effusion, or pneumothorax. IMPRESSION: No evidence of acute cardiopulmonary disease. Electronically Signed   By: Marnee SpringJonathon  Watts M.D.   On: 08/07/2016 22:01   Ct Abdomen Pelvis W Contrast  Result Date: 08/07/2016 CLINICAL DATA:  Sudden onset emesis at 02:30 this morning. EXAM: CT ABDOMEN AND PELVIS WITH CONTRAST TECHNIQUE: Multidetector CT imaging of the abdomen and pelvis was performed using the standard protocol following bolus administration of intravenous contrast. CONTRAST:  100mL ISOVUE-300 IOPAMIDOL (ISOVUE-300) INJECTION 61% COMPARISON:  None. FINDINGS: Lower chest: No significant abnormality Hepatobiliary: Prior cholecystectomy. Pneumobilia, perhaps related to prior biliary procedure. No focal liver lesions. No significant bile duct dilatation. Pancreas: The pancreas appears mildly atrophic. No focal pancreatic parenchymal lesion is evident. No significant pancreatic duct dilatation. No peripancreatic inflammatory changes. Spleen: Normal Adrenals/Urinary Tract: The adrenals and kidneys  are normal in appearance. There is no urinary calculus evident. There is no hydronephrosis or ureteral dilatation. Collecting systems and ureters appear unremarkable. Stomach/Bowel: There is dilatation of stomach and proximal-mid small bowel with abrupt transition to mid-distal small bowel. Transition point is poorly defined but probably within a small ventral hernia which is located immediately to the right of a large peristomal hernia. The peristomal hernia also contains a portion of small bowel proximal to the obstruction, and a segment of unobstructed colon. There are unremarkable appearances of the descending colon colostomy. No extraluminal air.  No pneumatosis. Vascular/Lymphatic: The abdominal aorta is normal in caliber. There is mild atherosclerotic calcification. There is no adenopathy in the abdomen or pelvis. Reproductive: Prior hysterectomy.  No adnexal abnormalities. Other: No ascites. Musculoskeletal: Moderate L2 compression and sclerosis, new/worsened from 03/25/2016. Unchanged L3 compression and augmentation. No suspicious skeletal lesions. IMPRESSION: 1. Small bowel obstruction with transition point in the proximal ileum. The transition is probably within a small ventral hernia which is located immediately to the right  of the large peristomal hernia. 2. New/worsened compression of L2.  No suspicious skeletal lesions. 3. Pneumobilia, likely related to prior biliary procedure. Electronically Signed   By: Ellery Plunk M.D.   On: 08/07/2016 22:16   Dg Abd Portable 1v-small Bowel Obstruction Protocol-initial, 8 Hr Delay  Result Date: 08/08/2016 CLINICAL DATA:  Vomiting. Small bowel obstruction. Image obtained after contrast administration through the patient's nasogastric tube 8 hours earlier. EXAM: PORTABLE ABDOMEN - 1 VIEW COMPARISON:  Abdomen radiographs obtained earlier today and abdomen and pelvis CT obtained yesterday. FINDINGS: Nasogastric tube tip in the distal stomach near the pylorus.  Left lower quadrant ostomy. Radiopaque contrast in normal caliber colon. Excrete contrast in a decompressed urinary bladder. L3 kyphoplasty material and L2 compression deformity. Diffuse osteopenia. IMPRESSION: The injected contrast has progressed into normal caliber colon. No visible dilated bowel loops are seen on this image. Electronically Signed   By: Beckie Salts M.D.   On: 08/08/2016 19:27   Dg Abd Portable 1v-small Bowel Protocol-position Verification  Result Date: 08/08/2016 CLINICAL DATA:  Enteric tube placement EXAM: PORTABLE ABDOMEN - 1 VIEW COMPARISON:  08/07/2016 CT abdomen/pelvis FINDINGS: Enteric tube terminates in the body of the stomach. Surgical sutures are seen throughout the abdomen. Mildly dilated small bowel loops in the right abdomen, unchanged. Mild colonic stool. No evidence of pneumatosis or pneumoperitoneum. Vertebroplasty change is seen in the mid lumbar spine. Clear lung bases. IMPRESSION: Enteric tube terminates in the body of the stomach. Persistent mildly dilated small bowel loops in the right abdomen. Electronically Signed   By: Delbert Phenix M.D.   On: 08/08/2016 08:49    Scheduled Meds: . atenolol  12.5 mg Oral Daily  . darifenacin  15 mg Oral Daily  . docusate sodium  50 mg Oral BID  . enoxaparin (LOVENOX) injection  40 mg Subcutaneous Q24H  . insulin aspart  0-9 Units Subcutaneous Q4H  . irbesartan  150 mg Oral Daily  . lidocaine  1 patch Transdermal Q24H  . lidocaine  1 patch Transdermal Q24H  . mouth rinse  15 mL Mouth Rinse BID  . multivitamin with minerals  1 tablet Oral Daily  . pantoprazole  40 mg Oral Daily  . polyethylene glycol  17 g Oral Daily  . predniSONE  7.5 mg Oral Daily   Continuous Infusions: . dextrose 5 % and 0.45 % NaCl with KCl 20 mEq/L 75 mL/hr (08/09/16 0453)    Principal Problem:   Hernia with obstruction Active Problems:   SBO (small bowel obstruction) (HCC)   DM2 (diabetes mellitus, type 2) (HCC)   HTN (hypertension)   SLE  (systemic lupus erythematosus) (HCC)    Time spent: 25 minutes     Vassie Loll  [facility] Pager [phone]. If 7PM-7AM, please contact night-coverage at [email or web address], password [facility] 08/09/2016, 6:06 PM  LOS: 2 days

## 2016-08-09 NOTE — Progress Notes (Signed)
S: Distention markedly improved, large BM and flatus in ostomy bag overnight  Vitals, labs, intake/output, and orders reviewed at this time.  Gen: A&Ox3, no distress  H&N: EOMI, atraumatic, neck supple Chest: unlabored respirations, RRR Abd: soft, nontender, nondistended, ostomy with stool in bag Ext: warm, no edema Neuro: grossly normal  Lines/tubes/drains: PIV, NGT  A/P:  82yo woman with CKD, Dm, lupus on steroids, HTN who was admitted with pSBO due to parastomal and incisional hernias. SBO protocol started; KUB last night showed progression of contrast to normal caliber colon and no visible dilated bowel loops. Will remove NG this morning and try sips of clears. Will order small bolus as she has only made 100cc of urine overnight.    Kristen Blakeshelsea Tamme Mozingo, MD [facility] [place] Pager [phone]

## 2016-08-09 NOTE — Progress Notes (Signed)
  Echocardiogram 2D Echocardiogram has been performed.  Kristen SavoyCasey N [name] 08/09/2016, 1:24 PM

## 2016-08-10 LAB — BASIC METABOLIC PANEL
ANION GAP: 5 (ref 5–15)
BUN: 12 mg/dL (ref 6–20)
CALCIUM: 7.9 mg/dL — AB (ref 8.9–10.3)
CHLORIDE: 101 mmol/L (ref 101–111)
CO2: 27 mmol/L (ref 22–32)
Creatinine, Ser: 0.93 mg/dL (ref 0.44–1.00)
GFR calc non Af Amer: 53 mL/min — ABNORMAL LOW (ref >60)
GLUCOSE: 99 mg/dL (ref 65–99)
POTASSIUM: 3.4 mmol/L — AB (ref 3.5–5.1)
Sodium: 133 mmol/L — ABNORMAL LOW (ref 135–145)

## 2016-08-10 LAB — GLUCOSE, CAPILLARY
GLUCOSE-CAPILLARY: 127 mg/dL — AB (ref 65–99)
GLUCOSE-CAPILLARY: 69 mg/dL (ref 65–99)
Glucose-Capillary: 127 mg/dL — ABNORMAL HIGH (ref 65–99)
Glucose-Capillary: 176 mg/dL — ABNORMAL HIGH (ref 65–99)
Glucose-Capillary: 78 mg/dL (ref 65–99)
Glucose-Capillary: 81 mg/dL (ref 65–99)

## 2016-08-10 MED ORDER — INSULIN ASPART 100 UNIT/ML ~~LOC~~ SOLN
0.0000 [IU] | Freq: Three times a day (TID) | SUBCUTANEOUS | Status: DC
  Administered 2016-08-10: 2 [IU] via SUBCUTANEOUS
  Administered 2016-08-11: 1 [IU] via SUBCUTANEOUS

## 2016-08-10 MED ORDER — INSULIN ASPART 100 UNIT/ML ~~LOC~~ SOLN
3.0000 [IU] | Freq: Three times a day (TID) | SUBCUTANEOUS | Status: DC
  Administered 2016-08-10 – 2016-08-11 (×2): 3 [IU] via SUBCUTANEOUS

## 2016-08-10 NOTE — Progress Notes (Signed)
Pt daughter refused her mom lidocaine patch at her rt shoulder to be removed, pt lying on her rt side sleeping her daughter  dont want me to turn her on her side to remove the lidocaine patch to wake her up, her daughter said she will call me when her mom wakes up to remove the patch will continue to monitor

## 2016-08-10 NOTE — Progress Notes (Signed)
[facility] PROGRESS NOTE  Kristen Wallace UEA:[id number] DOB: 31-Jul-1926 DOA: 08/07/2016 PCP: [name], MD  Interim summary and HPI 82 y.o. ? Prior abdominal surgeries including colostomy.   Patient presented to ED 9/11/17with c/o N/V abdominal pain.   Symptoms onset suddenly at 230 AM on 9/10, have been persistent since onset.   Vomiting was NBNB.  Abdominal pain is generalized.  Nothing makes symptoms better or worse.  CT scan with contrast in ED demonstrated SBO with transition point in a small ventral hernia which is next to her large stomal hernia.  Assessment/Plan:  1-abd pain: due to incisional hernia obstruction -improving with conservative management  -patient NGT removed today as per surgery rec's -repeat x-ray 9/11 showing resolution and contrast in the colon  -will continue advancing diet slowly as per Gen surgery-now on a regular consistency diet -start bowel regimen  -continue PRN antiemetics and analgesics   2-Diabetes Mellitus: -will use SSI, sugars 80-130 -continue holding actos 30 till d/c  3-elevated troponin on admission  -most likely due to demand ischemia -no acute changes on EKG or telemetry  -2-D echo reassuring  -patient denies CP and/or SOB -now off telemetry  4-chronic pain: affecting lower back, right shoulder and bilat knees -will resume home analgesic regimen  -restart Lidoderm patch  5-HTN: -continue home antihypertensive regimen atenolol 12.5 od , micardis 40 daily -BP is fairly well controlled -Heart rate in the 40s and 50s this morning so atenolol was held and we will need to discuss restarting this on discharge at a lower dose versus holding completely  6-Lupus: -continue chronic steroids prednisone 7.5 daily  7-GERD/GI protection  -continue PPI      Code Status: Full Family Communication: daughter at bedside  Disposition Plan: Doing fair no new issues but awaiting full resolution of SBO and tolerance of diet   Anticipate may be able to discharge 08/11/2016 if no new complaints or issues   Consultants:  General surgery   Procedures:  See below for x-ray reports   2-D echo: - Left ventricle: The cavity size was normal. There was moderate   concentric hypertrophy. Systolic function was normal. The   estimated ejection fraction was in the range of 55% to 60%. Wall   motion was normal; there were no regional wall motion   abnormalities. Doppler parameters are consistent with both   elevated ventricular end-diastolic filling pressure and elevated   left atrial filling pressure. - Left atrium: The atrium was moderately dilated. - Atrial septum: No defect or patent foramen ovale was identified.  Antibiotics:  None   HPI/Subjective:  Looks very well. At lunch was not very tasty but tolerated it all no nausea no belching no vomiting or diarrhea States that colostomy bag is full of air  Objective: Vitals:   08/10/16 0416 08/10/16 1053  BP: (!) 137/56   Pulse: 60 (!) 50  Resp: 18   Temp: 97.8 F (36.6 C)     Intake/Output Summary (Last 24 hours) at 08/10/16 1251 Last data filed at 08/10/16 0849  Gross per 24 hour  Intake            227.5 ml  Output              695 ml  Net           -467.5 ml   Filed Weights   08/07/16 1937 08/08/16 0046  Weight: 65.8 kg (145 lb) 63.3 kg (139 lb 9.6 oz)    Exam:   General:  Afebrile, denies nausea and vomiting. Patient NGT removed and diet advance. There is gas and stools in her colostomy bag. denies CP and SOB.  Cardiovascular: S1 and S2, no JVD, no rubs or gallops  Respiratory: CTA bilaterally  Abdomen: soft, positive BS, colostomy in left side (with stools inside);mild soreness on deep palpation, but no frank pain.  Musculoskeletal: no edema or cyanosis    Data Reviewed: Basic Metabolic Panel:  Recent Labs Lab 08/07/16 2030 08/09/16 1009 08/10/16 0650  NA 137 138 133*  K 3.4* 3.1* 3.4*  CL 97* 103 101  CO2 31 28 27    GLUCOSE 126* 144* 99  BUN 25* 18 12  CREATININE 0.99 1.00 0.93  CALCIUM 9.0 8.1* 7.9*  MG  --  1.7  --    Liver Function Tests:  Recent Labs Lab 08/07/16 2030  AST 29  ALT 15  ALKPHOS 58  BILITOT 1.8*  PROT 5.8*  ALBUMIN 3.5   CBC:  Recent Labs Lab 08/07/16 2030 08/09/16 1009  WBC 12.1* 8.5  NEUTROABS 9.8*  --   HGB 13.2 10.7*  HCT 39.4 33.3*  MCV 103.1* 104.1*  PLT 162 149*   Cardiac Enzymes:  Recent Labs Lab 08/07/16 2030 08/08/16 0000 08/08/16 0947 08/08/16 1655  TROPONINI 0.09* 0.13* 0.18* 0.17*   CBG:  Recent Labs Lab 08/09/16 1136 08/09/16 2006 08/10/16 0413 08/10/16 0759 08/10/16 1134  GLUCAP 141* 137* 78 81 127*    Recent Results (from the past 240 hour(s))  Urine culture     Status: None   Collection Time: 08/08/16  2:28 AM  Result Value Ref Range Status   Specimen Description URINE, RANDOM  Final   Special Requests NONE  Final   Culture NO GROWTH  Final   Report Status 08/09/2016 FINAL  Final     Studies: Dg Abd Portable 1v-small Bowel Obstruction Protocol-initial, 8 Hr Delay  Result Date: 08/08/2016 CLINICAL DATA:  Vomiting. Small bowel obstruction. Image obtained after contrast administration through the patient's nasogastric tube 8 hours earlier. EXAM: PORTABLE ABDOMEN - 1 VIEW COMPARISON:  Abdomen radiographs obtained earlier today and abdomen and pelvis CT obtained yesterday. FINDINGS: Nasogastric tube tip in the distal stomach near the pylorus. Left lower quadrant ostomy. Radiopaque contrast in normal caliber colon. Excrete contrast in a decompressed urinary bladder. L3 kyphoplasty material and L2 compression deformity. Diffuse osteopenia. IMPRESSION: The injected contrast has progressed into normal caliber colon. No visible dilated bowel loops are seen on this image. Electronically Signed   By: Beckie Salts M.D.   On: 08/08/2016 19:27    Scheduled Meds: . atenolol  12.5 mg Oral Daily  . darifenacin  15 mg Oral Daily  .  docusate sodium  50 mg Oral BID  . enoxaparin (LOVENOX) injection  40 mg Subcutaneous Q24H  . insulin aspart  0-9 Units Subcutaneous Q4H  . irbesartan  150 mg Oral Daily  . lidocaine  1 patch Transdermal Q24H  . lidocaine  1 patch Transdermal Q24H  . mouth rinse  15 mL Mouth Rinse BID  . multivitamin with minerals  1 tablet Oral Daily  . pantoprazole  40 mg Oral Daily  . polyethylene glycol  17 g Oral Daily  . predniSONE  7.5 mg Oral Daily   Continuous Infusions: . sodium chloride 10 mL/hr at 08/10/16 1207    Principal Problem:   Hernia with obstruction Active Problems:   SBO (small bowel obstruction) (HCC)   DM2 (diabetes mellitus, type 2) (HCC)   HTN (hypertension)  SLE (systemic lupus erythematosus) (HCC)    Time spent: 25 minutes     Pleas KochJai Deryl Giroux, MD [facility] [phone]

## 2016-08-10 NOTE — Progress Notes (Signed)
Pt daughter refused her mom CBG to be checked, she said she wants it to be ACHS, per pt daughter request CBG not checked at 0000, will continue to monitor pt

## 2016-08-10 NOTE — Care Management Important Message (Signed)
Important Message  Patient Details  Name: Carmelia BakeHelen Meiring MRN: [id number] Date of Birth: 1926-10-23   Medicare Important Message Given:  Yes    Kyla BalzarineShealy, Sheng Pritz Abena 08/10/2016, 11:43 AM

## 2016-08-10 NOTE — Progress Notes (Signed)
Patient ID: Kristen Wallace, female   DOB: 1926-10-16, 82 y.o.   MRN: [id number] [facility] Progress Note:   * No surgery found *  Subjective: Mental status is clear but she is hard of hearing.  Abdomen is flat and bowe Objective: Vital signs in last 24 hours: Temp:  [97.7 F (36.5 C)-98.2 F (36.8 C)] 97.8 F (36.6 C) (09/13 0416) Pulse Rate:  [50-61] 50 (09/13 1053) Resp:  [17-19] 18 (09/13 0416) BP: (107-137)/(47-56) 137/56 (09/13 0416) SpO2:  [97 %-99 %] 97 % (09/13 0416)  Intake/Output from previous day: 09/12 0701 - 09/13 0700 In: 1231.3 [P.O.:220; I.V.:1011.3] Out: 420 [Urine:275; Stool:145] Intake/Output this shift: Total I/O In: -  Out: 275 [Urine:275]  Physical Exam: Work of breathing is normal.  Abdomen is flat and ostomy is working.  Taking clears.   Lab Results:  Results for orders placed or performed during the hospital encounter of 08/07/16 (from the past 48 hour(s))  Glucose, capillary     Status: None   Collection Time: 08/08/16 12:15 PM  Result Value Ref Range   Glucose-Capillary 87 65 - 99 mg/dL  Troponin I (q 6hr x 3)     Status: Abnormal   Collection Time: 08/08/16  4:55 PM  Result Value Ref Range   Troponin I 0.17 (HH) <0.03 ng/mL    Comment: CRITICAL VALUE NOTED.  VALUE IS CONSISTENT WITH PREVIOUSLY REPORTED AND CALLED VALUE.  Glucose, capillary     Status: None   Collection Time: 08/08/16  5:08 PM  Result Value Ref Range   Glucose-Capillary 77 65 - 99 mg/dL  Glucose, capillary     Status: None   Collection Time: 08/08/16  7:59 PM  Result Value Ref Range   Glucose-Capillary 88 65 - 99 mg/dL  Glucose, capillary     Status: None   Collection Time: 08/08/16 11:43 PM  Result Value Ref Range   Glucose-Capillary 85 65 - 99 mg/dL  Glucose, capillary     Status: None   Collection Time: 08/09/16  4:46 AM  Result Value Ref Range   Glucose-Capillary 96 65 - 99 mg/dL  Glucose, capillary     Status: None   Collection Time: 08/09/16  8:25 AM   Result Value Ref Range   Glucose-Capillary 96 65 - 99 mg/dL  CBC     Status: Abnormal   Collection Time: 08/09/16 10:09 AM  Result Value Ref Range   WBC 8.5 4.0 - 10.5 K/uL   RBC 3.20 (L) 3.87 - 5.11 MIL/uL   Hemoglobin 10.7 (L) 12.0 - 15.0 g/dL   HCT 33.3 (L) 36.0 - 46.0 %   MCV 104.1 (H) 78.0 - 100.0 fL   MCH 33.4 26.0 - 34.0 pg   MCHC 32.1 30.0 - 36.0 g/dL   RDW 12.5 11.5 - 15.5 %   Platelets 149 (L) 150 - 400 K/uL  Basic metabolic panel     Status: Abnormal   Collection Time: 08/09/16 10:09 AM  Result Value Ref Range   Sodium 138 135 - 145 mmol/L   Potassium 3.1 (L) 3.5 - 5.1 mmol/L   Chloride 103 101 - 111 mmol/L   CO2 28 22 - 32 mmol/L   Glucose, Bld 144 (H) 65 - 99 mg/dL   BUN 18 6 - 20 mg/dL   Creatinine, Ser 1.00 0.44 - 1.00 mg/dL   Calcium 8.1 (L) 8.9 - 10.3 mg/dL   GFR calc non Af Amer 49 (L) >60 mL/min   GFR calc Af Amer 57 (  L) >60 mL/min    Comment: (NOTE) The eGFR has been calculated using the CKD EPI equation. This calculation has not been validated in all clinical situations. eGFR's persistently <60 mL/min signify possible Chronic Kidney Disease.    Anion gap 7 5 - 15  Magnesium     Status: None   Collection Time: 08/09/16 10:09 AM  Result Value Ref Range   Magnesium 1.7 1.7 - 2.4 mg/dL  Glucose, capillary     Status: Abnormal   Collection Time: 08/09/16 11:36 AM  Result Value Ref Range   Glucose-Capillary 141 (H) 65 - 99 mg/dL  Glucose, capillary     Status: Abnormal   Collection Time: 08/09/16  8:06 PM  Result Value Ref Range   Glucose-Capillary 137 (H) 65 - 99 mg/dL  Glucose, capillary     Status: None   Collection Time: 08/10/16  4:13 AM  Result Value Ref Range   Glucose-Capillary 78 65 - 99 mg/dL  Basic metabolic panel     Status: Abnormal   Collection Time: 08/10/16  6:50 AM  Result Value Ref Range   Sodium 133 (L) 135 - 145 mmol/L   Potassium 3.4 (L) 3.5 - 5.1 mmol/L   Chloride 101 101 - 111 mmol/L   CO2 27 22 - 32 mmol/L   Glucose,  Bld 99 65 - 99 mg/dL   BUN 12 6 - 20 mg/dL   Creatinine, Ser 0.93 0.44 - 1.00 mg/dL   Calcium 7.9 (L) 8.9 - 10.3 mg/dL   GFR calc non Af Amer 53 (L) >60 mL/min   GFR calc Af Amer >60 >60 mL/min    Comment: (NOTE) The eGFR has been calculated using the CKD EPI equation. This calculation has not been validated in all clinical situations. eGFR's persistently <60 mL/min signify possible Chronic Kidney Disease.    Anion gap 5 5 - 15  Glucose, capillary     Status: None   Collection Time: 08/10/16  7:59 AM  Result Value Ref Range   Glucose-Capillary 81 65 - 99 mg/dL   Comment 1 Notify RN    Comment 2 Document in Chart   Glucose, capillary     Status: Abnormal   Collection Time: 08/10/16 11:34 AM  Result Value Ref Range   Glucose-Capillary 127 (H) 65 - 99 mg/dL    Radiology/Results: Dg Abd Portable 1v-small Bowel Obstruction Protocol-initial, 8 Hr Delay  Result Date: 08/08/2016 CLINICAL DATA:  Vomiting. Small bowel obstruction. Image obtained after contrast administration through the patient's nasogastric tube 8 hours earlier. EXAM: PORTABLE ABDOMEN - 1 VIEW COMPARISON:  Abdomen radiographs obtained earlier today and abdomen and pelvis CT obtained yesterday. FINDINGS: Nasogastric tube tip in the distal stomach near the pylorus. Left lower quadrant ostomy. Radiopaque contrast in normal caliber colon. Excrete contrast in a decompressed urinary bladder. L3 kyphoplasty material and L2 compression deformity. Diffuse osteopenia. IMPRESSION: The injected contrast has progressed into normal caliber colon. No visible dilated bowel loops are seen on this image. Electronically Signed   By: Claudie Revering M.D.   On: 08/08/2016 19:27    Anti-infectives: Anti-infectives    None      Assessment/Plan: Problem List: Patient Active Problem List   Diagnosis Date Noted  . Hernia with obstruction 08/08/2016  . DM2 (diabetes mellitus, type 2) ([place]) 08/08/2016  . HTN (hypertension) 08/08/2016  . SLE  (systemic lupus erythematosus) ([place]) 08/08/2016  . SBO (small bowel obstruction) ([place]) 08/07/2016  . Compression fracture of L3 lumbar vertebra ([place]) 03/02/2016  .  Septic shock ([place]) 02/04/2013  . Bacteremia 02/04/2013  . Fever and chills 02/03/2013  . Leukocytosis 02/03/2013  . Hypotension, unspecified 02/03/2013    Advance to regular diet.  Hopeful home tomorrow.  Saw her with her daughters.   * No surgery found *    LOS: 3 days   Matt B. Hassell Done, MD, [facility] [phone] beeper [phone]  08/10/2016 11:38 AM

## 2016-08-10 NOTE — Progress Notes (Signed)
Pt was retaining of urine per bladder scan, her daughter refused in and out catheter because she thinks her mom is being traumatise by the in and out catheter because sher c/o burning on peeing she said she wants to talk to her daughter this morning to see what plan the Dr has for her mom not able to pee well by herself, pt was able to pee 275 by her self, pt daughter removed lidocaine patch by her, will continue to monitor

## 2016-08-11 LAB — GLUCOSE, CAPILLARY
GLUCOSE-CAPILLARY: 83 mg/dL (ref 65–99)
Glucose-Capillary: 140 mg/dL — ABNORMAL HIGH (ref 65–99)

## 2016-08-11 NOTE — Progress Notes (Signed)
Subjective: She reports issues with constipation at home just on colace.  She has liquid stool and allot of gas in the ostomy bag.  She is now on Miralax and they are planning to send her home later today on bowel regime. No tenderness, or nausea, not eating much which she reports is her normal baseline.   Objective: Vital signs in last 24 hours: Temp:  [98.1 F (36.7 C)-98.9 F (37.2 C)] 98.3 F (36.8 C) (09/14 0537) Pulse Rate:  [55-69] 69 (09/14 0537) Resp:  [18-20] 20 (09/14 0537) BP: (122-145)/(43-79) 145/79 (09/14 0537) SpO2:  [96 %-99 %] 96 % (09/14 0537) Last BM Date: 08/10/16 360 PO Urine 1075. No stool recorded Afebrile, VSS No labs or films  Intake/Output from previous day: 09/13 0701 - 09/14 0700 In: 1403.3 [P.O.:360; I.V.:1043.3] Out: 1075 [Urine:1075] Intake/Output this shift: Total I/O In: 240 [P.O.:240] Out: 225 [Urine:225]  General appearance: alert, cooperative and no distress GI: soft, not tender, + BS, some stool in the ostomy, large parastomal hernia is soft.   Lab Results:   Recent Labs  08/09/16 1009  WBC 8.5  HGB 10.7*  HCT 33.3*  PLT 149*    BMET  Recent Labs  08/09/16 1009 08/10/16 0650  NA 138 133*  K 3.1* 3.4*  CL 103 101  CO2 28 27  GLUCOSE 144* 99  BUN 18 12  CREATININE 1.00 0.93  CALCIUM 8.1* 7.9*   PT/INR No results for input(s): LABPROT, INR in the last 72 hours.   Recent Labs Lab 08/07/16 2030  AST 29  ALT 15  ALKPHOS 58  BILITOT 1.8*  PROT 5.8*  ALBUMIN 3.5     Lipase  No results found for: LIPASE   Studies/Results: No results found. Prior to Admission medications   Medication Sig Start Date End Date Taking? Authorizing Provider  atenolol (TENORMIN) 25 MG tablet Take 12.5 mg by mouth daily.   Yes Historical Provider, MD  darifenacin (ENABLEX) 15 MG 24 hr tablet Take 15 mg by mouth daily.   Yes Historical Provider, MD  furosemide (LASIX) 40 MG tablet Take 40 mg by mouth daily.   Yes Historical  Provider, MD  Hypromellose (ARTIFICIAL TEARS OP) Place 1 drop into both eyes 3 (three) times daily as needed (dry eyes).   Yes Historical Provider, MD  lidocaine (LIDODERM) 5 % APPLY 1 PATCH TO INTACT SKIN REMOVE AFTER 12 HOURS ONCE A DAY EXTERNALLY 02/10/16  Yes Historical Provider, MD  omeprazole (PRILOSEC) 20 MG capsule Take 20 mg by mouth daily.   Yes Historical Provider, MD  oxyCODONE-acetaminophen (PERCOCET) 10-325 MG tablet Take 1 tablet by mouth every 4 (four) hours as needed for pain.  02/15/16  Yes Historical Provider, MD  predniSONE (DELTASONE) 5 MG tablet Take 7.5 mg by mouth daily. 02/12/16  Yes Historical Provider, MD  Probiotic Product (PROBIOTIC PO) Take 1 capsule by mouth daily.   Yes Historical Provider, MD  telmisartan (MICARDIS) 40 MG tablet Take 40 mg by mouth daily.   Yes Historical Provider, MD    Medications: . atenolol  12.5 mg Oral Daily  . darifenacin  15 mg Oral Daily  . docusate sodium  50 mg Oral BID  . enoxaparin (LOVENOX) injection  40 mg Subcutaneous Q24H  . insulin aspart  0-9 Units Subcutaneous TID WC  . insulin aspart  3 Units Subcutaneous TID WC  . irbesartan  150 mg Oral Daily  . lidocaine  1 patch Transdermal Q24H  . lidocaine  1 patch Transdermal  Q24H  . mouth rinse  15 mL Mouth Rinse BID  . multivitamin with minerals  1 tablet Oral Daily  . pantoprazole  40 mg Oral Daily  . polyethylene glycol  17 g Oral Daily  . predniSONE  7.5 mg Oral Daily   . sodium chloride 10 mL/hr at 08/10/16 1207   Chronic renal insuffiencey AODM Lupus on chronic steroids Hypertension Macular degeneratin Spinal stenosis/lumbar kyphoplasty 02/2016  Assessment/Plan PSBO, descending colostomy with parastomal and ventral hernias.  Hx of diverticular stricture and colectomy colostomy which she choose to keep secondary to chronic fecal incontinence. 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  No surgical issue currently, will see again as needed please call     LOS: 4 days    Saketh Daubert 08/11/2016 [phone]

## 2016-08-11 NOTE — Progress Notes (Signed)
Pt discharged home with daughter. Discharge instructions were reviewed with patient and daughter. All question were answered.

## 2016-08-11 NOTE — Discharge Summary (Signed)
Physician Discharge Summary  Kristen Wallace ZOX:[id number] DOB: 06-Apr-1926 DOA: 08/07/2016  PCP: [name], MD  Admit date: 08/07/2016 Discharge date: 08/11/2016  Time spent: 35 minutes  Recommendations for Outpatient Follow-up:  1. Patient should have follow-up closely as an outpatient with primary care physician-noted that patient is on atenolol which can be substituted short acting metoprolol and may benefit from discussion about this because of his mild baseline bradycardia 2. Recommend compression stockings as an outpatient and titration of Lasix as needed-creatinine was stable 3. The patient will need a CBC and differential as well as a basic metabolic panel within a week. 4. Might benefit from work-up up of microcytic anemia as an outpatient although the patient will be due in terms of overall debility and plans   Discharge Diagnoses:  Principal Problem:   Hernia with obstruction Active Problems:   SBO (small bowel obstruction) ([facility])   DM2 (diabetes mellitus, type 2) ([facility])   HTN (hypertension)   SLE (systemic lupus erythematosus) ([facility])   Discharge Condition: good  Diet recommendation: soft hh low salt  Filed Weights   08/07/16 1937 08/08/16 0046  Weight: 65.8 kg (145 lb) 63.3 kg (139 lb 9.6 oz)    History of present illness:  82 y.o. ? Prior abdominal surgeries including colostomy.   Patient presented to ED 9/11/17with c/o N/V abdominal pain.   Symptoms onset suddenly at 230 AM on 9/10, have been persistent since onset.   Vomiting was NBNB.  Abdominal pain is generalized.  Nothing makes symptoms better or worse.   CT scan with contrast in ED demonstrated SBO with transition point in a small ventral hernia which is next to her large stomal hernia.     Hospital Course:  1-abd pain: due to incisional hernia obstruction -improved c conservative management s -repeat x-ray 9/11 showing resolution and contrast in the colon  -advanced diet slowly as per Gen  surgery-now on a regular consistency diet -start bowel regimen    2-Diabetes Mellitus: -will use SSI, sugars 80-130 -continue holding actos 30 till d/c -might need alternate meds as OP-looks like was taken off of this by PCP /neprho 2/2 to LE swelling concerns   3-elevated troponin on admission  -most likely due to demand ischemia -no acute changes on EKG or telemetry  -2-D echo reassuring  -patient denies CP and/or SOB   4-chronic pain: affecting lower back, right shoulder and bilat knees -will resume home analgesic regimen  -restart Lidoderm patch   5-HTN: -continue home antihypertensive regimen atenolol 12.5 od , micardis 40 daily -BP is fairly well controlled -Heart rate in the 40s and 50s this morning so atenolol was held - deferred lower dose versus changing the medication to short acting metoprolol    6-Lupus: -continue chronic steroids prednisone 7.5 daily   Consultants:  General surgery    Procedures:  See below for x-ray reports   2-D echo: - Left ventricle: The cavity size was normal. There was moderate   concentric hypertrophy. Systolic function was normal. The   estimated ejection fraction was in the range of 55% to 60%. Wall   motion was normal; there were no regional wall motion   abnormalities. Doppler parameters are consistent with both   elevated ventricular end-diastolic filling pressure and elevated   left atrial filling pressure. - Left atrium: The atrium was moderately dilated. - Atrial septum: No defect or patent foramen ovale was identified.   Antibiotics:  None     Discharge Exam: Vitals:   08/10/16 2146  08/11/16 0537  BP:  (!) 145/79  Pulse: 66 69  Resp:  20  Temp:  98.3 F (36.8 C)   Exam:    General:  Afebrile, denies nausea and vomiting.  There is gas and stools in her colostomy bag.   Cardiovascular: S1 and S2, no JVD, no rubs or gallops  Respiratory: CTA bilaterally  Abdomen: soft, positive BS, colostomy in left  side (with stools inside);mild soreness on deep palpation, but no frank pain.  Musculoskeletal: no edema or cyanosis      Discharge Instructions   Discharge Instructions    Diet - low sodium heart healthy    Complete by:  As directed    Discharge instructions    Complete by:  As directed    Please dress the wound on the back appropriately with occlusive silicone dressing Would recommend outpatient follow-up with primary physician-continue Lasix 40 mg daily and if he gained more than 2 pounds over a 24-hour period of time I would recommend taking an extra half tablet of 20 mg in addition to the 40 mg Limit oral fluid intake to 1500 cc in the cold months and 1800 cc in the warm months You may use compression stockings He did not need further surgical follow-up chest monitoring of diet and making sure that you having good passage of flatus and stool in your colostomy bag Compression stockings are recommended during the day if you can tolerate   Increase activity slowly    Complete by:  As directed    PR SILICONE GEL SHEET, EACH    Complete by:  As directed      Current Discharge Medication List    CONTINUE these medications which have NOT CHANGED   Details  atenolol (TENORMIN) 25 MG tablet Take 12.5 mg by mouth daily.    darifenacin (ENABLEX) 15 MG 24 hr tablet Take 15 mg by mouth daily.    furosemide (LASIX) 40 MG tablet Take 40 mg by mouth daily.    Hypromellose (ARTIFICIAL TEARS OP) Place 1 drop into both eyes 3 (three) times daily as needed (dry eyes).    lidocaine (LIDODERM) 5 % APPLY 1 PATCH TO INTACT SKIN REMOVE AFTER 12 HOURS ONCE A DAY EXTERNALLY Refills: 5    omeprazole (PRILOSEC) 20 MG capsule Take 20 mg by mouth daily.    oxyCODONE-acetaminophen (PERCOCET) 10-325 MG tablet Take 1 tablet by mouth every 4 (four) hours as needed for pain.  Refills: 0    predniSONE (DELTASONE) 5 MG tablet Take 7.5 mg by mouth daily. Refills: 3    Probiotic Product (PROBIOTIC PO)  Take 1 capsule by mouth daily.    telmisartan (MICARDIS) 40 MG tablet Take 40 mg by mouth daily.      STOP taking these medications     Multiple Vitamin (MULTIVITAMIN) capsule        Allergies  Allergen Reactions  . Betadine [Povidone Iodine] Itching and Other (See Comments)    Burns eyes   Follow-up Information    Londell MohPHARR,WALTER DAVIDSON, MD.   Specialty:  Internal Medicine Contact information: [address] [place] [address] [phone]            The results of significant diagnostics from this hospitalization (including imaging, microbiology, ancillary and laboratory) are listed below for reference.    Significant Diagnostic Studies: Dg Chest 2 View  Result Date: 08/07/2016 CLINICAL DATA:  Chest pain and elevated troponin EXAM: CHEST  2 VIEW COMPARISON:  02/02/2013 FINDINGS: Eventration of the  right diaphragm. Normal heart size and mediastinal contours. There is no edema, consolidation, effusion, or pneumothorax. IMPRESSION: No evidence of acute cardiopulmonary disease. Electronically Signed   By: Marnee Spring M.D.   On: 08/07/2016 22:01   Ct Abdomen Pelvis W Contrast  Result Date: 08/07/2016 CLINICAL DATA:  Sudden onset emesis at 02:30 this morning. EXAM: CT ABDOMEN AND PELVIS WITH CONTRAST TECHNIQUE: Multidetector CT imaging of the abdomen and pelvis was performed using the standard protocol following bolus administration of intravenous contrast. CONTRAST:  ISOVUE-300 IOPAMIDOL (ISOVUE-300) INJECTION 61% COMPARISON:  None. FINDINGS: Lower chest: No significant abnormality Hepatobiliary: Prior cholecystectomy. Pneumobilia, perhaps related to prior biliary procedure. No focal liver lesions. No significant bile duct dilatation. Pancreas: The pancreas appears mildly atrophic. No focal pancreatic parenchymal lesion is evident. No significant pancreatic duct dilatation. No peripancreatic inflammatory changes. Spleen: Normal Adrenals/Urinary Tract:  The adrenals and kidneys are normal in appearance. There is no urinary calculus evident. There is no hydronephrosis or ureteral dilatation. Collecting systems and ureters appear unremarkable. Stomach/Bowel: There is dilatation of stomach and proximal-mid small bowel with abrupt transition to mid-distal small bowel. Transition point is poorly defined but probably within a small ventral hernia which is located immediately to the right of a large peristomal hernia. The peristomal hernia also contains a portion of small bowel proximal to the obstruction, and a segment of unobstructed colon. There are unremarkable appearances of the descending colon colostomy. No extraluminal air.  No pneumatosis. Vascular/Lymphatic: The abdominal aorta is normal in caliber. There is mild atherosclerotic calcification. There is no adenopathy in the abdomen or pelvis. Reproductive: Prior hysterectomy.  No adnexal abnormalities. Other: No ascites. Musculoskeletal: Moderate L2 compression and sclerosis, new/worsened from 03/25/2016. Unchanged L3 compression and augmentation. No suspicious skeletal lesions. IMPRESSION: 1. Small bowel obstruction with transition point in the proximal ileum. The transition is probably within a small ventral hernia which is located immediately to the right of the large peristomal hernia. 2. New/worsened compression of L2.  No suspicious skeletal lesions. 3. Pneumobilia, likely related to prior biliary procedure. Electronically Signed   By: Ellery Plunk M.D.   On: 08/07/2016 22:16   Dg Abd Portable 1v-small Bowel Obstruction Protocol-initial, 8 Hr Delay  Result Date: 08/08/2016 CLINICAL DATA:  Vomiting. Small bowel obstruction. Image obtained after contrast administration through the patient's nasogastric tube 8 hours earlier. EXAM: PORTABLE ABDOMEN - 1 VIEW COMPARISON:  Abdomen radiographs obtained earlier today and abdomen and pelvis CT obtained yesterday. FINDINGS: Nasogastric tube tip in the distal  stomach near the pylorus. Left lower quadrant ostomy. Radiopaque contrast in normal caliber colon. Excrete contrast in a decompressed urinary bladder. L3 kyphoplasty material and L2 compression deformity. Diffuse osteopenia. IMPRESSION: The injected contrast has progressed into normal caliber colon. No visible dilated bowel loops are seen on this image. Electronically Signed   By: Beckie Salts M.D.   On: 08/08/2016 19:27   Dg Abd Portable 1v-small Bowel Protocol-position Verification  Result Date: 08/08/2016 CLINICAL DATA:  Enteric tube placement EXAM: PORTABLE ABDOMEN - 1 VIEW COMPARISON:  08/07/2016 CT abdomen/pelvis FINDINGS: Enteric tube terminates in the body of the stomach. Surgical sutures are seen throughout the abdomen. Mildly dilated small bowel loops in the right abdomen, unchanged. Mild colonic stool. No evidence of pneumatosis or pneumoperitoneum. Vertebroplasty change is seen in the mid lumbar spine. Clear lung bases. IMPRESSION: Enteric tube terminates in the body of the stomach. Persistent mildly dilated small bowel loops in the right abdomen. Electronically Signed   By: Tomasa Hose  Poff M.D.   On: 08/08/2016 08:49    Microbiology: Recent Results (from the past 240 hour(s))  Urine culture     Status: None   Collection Time: 08/08/16  2:28 AM  Result Value Ref Range Status   Specimen Description URINE, RANDOM  Final   Special Requests NONE  Final   Culture NO GROWTH  Final   Report Status 08/09/2016 FINAL  Final     Labs: Basic Metabolic Panel:  Recent Labs Lab 08/07/16 2030 08/09/16 1009 08/10/16 0650  NA 137 138 133*  K 3.4* 3.1* 3.4*  CL 97* 103 101  CO2 31 28 27   GLUCOSE 126* 144* 99  BUN 25* 18 12  CREATININE 0.99 1.00 0.93  CALCIUM 9.0 8.1* 7.9*  MG  --  1.7  --    Liver Function Tests:  Recent Labs Lab 08/07/16 2030  AST 29  ALT 15  ALKPHOS 58  BILITOT 1.8*  PROT 5.8*  ALBUMIN 3.5   No results for input(s): LIPASE, AMYLASE in the last 168 hours. No  results for input(s): AMMONIA in the last 168 hours. CBC:  Recent Labs Lab 08/07/16 2030 08/09/16 1009  WBC 12.1* 8.5  NEUTROABS 9.8*  --   HGB 13.2 10.7*  HCT 39.4 33.3*  MCV 103.1* 104.1*  PLT 162 149*   Cardiac Enzymes:  Recent Labs Lab 08/07/16 2030 08/08/16 0000 08/08/16 0947 08/08/16 1655  TROPONINI 0.09* 0.13* 0.18* 0.17*   BNP: BNP (last 3 results) No results for input(s): BNP in the last 8760 hours.  ProBNP (last 3 results) No results for input(s): PROBNP in the last 8760 hours.  CBG:  Recent Labs Lab 08/10/16 1134 08/10/16 1644 08/10/16 2149 08/11/16 0002 08/11/16 0750  GLUCAP 127* 176* 69 127* 83       Signed:  Rhetta Mura MD   [facility] 08/11/2016, 9:53 AM

## 2016-10-03 ENCOUNTER — Emergency Department (HOSPITAL_BASED_OUTPATIENT_CLINIC_OR_DEPARTMENT_OTHER): Payer: Medicare Other

## 2016-10-03 ENCOUNTER — Emergency Department (HOSPITAL_BASED_OUTPATIENT_CLINIC_OR_DEPARTMENT_OTHER)
Admission: EM | Admit: 2016-10-03 | Discharge: 2016-10-03 | Disposition: A | Discharge status: Home / Self Care | Payer: Medicare Other | Attending: Emergency Medicine | Admitting: Emergency Medicine

## 2016-10-03 ENCOUNTER — Encounter (HOSPITAL_BASED_OUTPATIENT_CLINIC_OR_DEPARTMENT_OTHER): Payer: Self-pay | Admitting: Emergency Medicine

## 2016-10-03 DIAGNOSIS — R509 Fever, unspecified: Secondary | ICD-10-CM | POA: Diagnosis present

## 2016-10-03 DIAGNOSIS — E119 Type 2 diabetes mellitus without complications: Secondary | ICD-10-CM | POA: Diagnosis not present

## 2016-10-03 DIAGNOSIS — N390 Urinary tract infection, site not specified: Secondary | ICD-10-CM | POA: Insufficient documentation

## 2016-10-03 DIAGNOSIS — I10 Essential (primary) hypertension: Secondary | ICD-10-CM | POA: Insufficient documentation

## 2016-10-03 LAB — CBC WITH DIFFERENTIAL/PLATELET
BASOS PCT: 0 %
Basophils Absolute: 0 10*3/uL (ref 0.0–0.1)
Eosinophils Absolute: 0 10*3/uL (ref 0.0–0.7)
Eosinophils Relative: 0 %
HEMATOCRIT: 34.2 % — AB (ref 36.0–46.0)
HEMOGLOBIN: 11.5 g/dL — AB (ref 12.0–15.0)
LYMPHS ABS: 1.5 10*3/uL (ref 0.7–4.0)
LYMPHS PCT: 10 %
MCH: 35 pg — AB (ref 26.0–34.0)
MCHC: 33.6 g/dL (ref 30.0–36.0)
MCV: 104 fL — AB (ref 78.0–100.0)
MONO ABS: 1.2 10*3/uL — AB (ref 0.1–1.0)
MONOS PCT: 8 %
NEUTROS ABS: 12 10*3/uL — AB (ref 1.7–7.7)
NEUTROS PCT: 82 %
Platelets: 142 10*3/uL — ABNORMAL LOW (ref 150–400)
RBC: 3.29 MIL/uL — ABNORMAL LOW (ref 3.87–5.11)
RDW: 12 % (ref 11.5–15.5)
WBC: 14.8 10*3/uL — ABNORMAL HIGH (ref 4.0–10.5)

## 2016-10-03 LAB — COMPREHENSIVE METABOLIC PANEL
ALK PHOS: 59 U/L (ref 38–126)
ALT: 20 U/L (ref 14–54)
ANION GAP: 6 (ref 5–15)
AST: 30 U/L (ref 15–41)
Albumin: 3 g/dL — ABNORMAL LOW (ref 3.5–5.0)
BUN: 28 mg/dL — ABNORMAL HIGH (ref 6–20)
CALCIUM: 8.6 mg/dL — AB (ref 8.9–10.3)
CHLORIDE: 99 mmol/L — AB (ref 101–111)
CO2: 31 mmol/L (ref 22–32)
CREATININE: 1.11 mg/dL — AB (ref 0.44–1.00)
GFR, EST AFRICAN AMERICAN: 50 mL/min — AB (ref >60)
GFR, EST NON AFRICAN AMERICAN: 43 mL/min — AB (ref >60)
Glucose, Bld: 124 mg/dL — ABNORMAL HIGH (ref 65–99)
Potassium: 3.8 mmol/L (ref 3.5–5.1)
Sodium: 136 mmol/L (ref 135–145)
Total Bilirubin: 1.9 mg/dL — ABNORMAL HIGH (ref 0.3–1.2)
Total Protein: 5.2 g/dL — ABNORMAL LOW (ref 6.5–8.1)

## 2016-10-03 LAB — URINE MICROSCOPIC-ADD ON

## 2016-10-03 LAB — URINALYSIS, ROUTINE W REFLEX MICROSCOPIC
BILIRUBIN URINE: NEGATIVE
GLUCOSE, UA: NEGATIVE mg/dL
HGB URINE DIPSTICK: NEGATIVE
KETONES UR: NEGATIVE mg/dL
Nitrite: NEGATIVE
PH: 6 (ref 5.0–8.0)
Protein, ur: NEGATIVE mg/dL
SPECIFIC GRAVITY, URINE: 1.015 (ref 1.005–1.030)

## 2016-10-03 LAB — LIPASE, BLOOD: LIPASE: 26 U/L (ref 11–51)

## 2016-10-03 LAB — I-STAT CG4 LACTIC ACID, ED: LACTIC ACID, VENOUS: 1.54 mmol/L (ref 0.5–1.9)

## 2016-10-03 MED ORDER — LEVOFLOXACIN 750 MG PO TABS: 750.0000 mg | ORAL_TABLET | Freq: Every day | ORAL | 0 refills | Status: AC

## 2016-10-03 MED ORDER — SODIUM CHLORIDE 0.9 % IV BOLUS (SEPSIS)
500.0000 mL | Freq: Once | INTRAVENOUS | Status: AC
  Administered 2016-10-03: 500 mL via INTRAVENOUS

## 2016-10-03 MED ORDER — DEXTROSE 5 % IV SOLN
1.0000 g | Freq: Once | INTRAVENOUS | Status: AC
  Administered 2016-10-03: 1 g via INTRAVENOUS
  Filled 2016-10-03: qty 10

## 2016-10-03 NOTE — ED Notes (Signed)
ED Provider at bedside. 

## 2016-10-03 NOTE — ED Provider Notes (Signed)
Emergency Department Provider Note  By signing my name below, I, Teofilo PodMatthew P. Jamison, attest that this documentation has been prepared under the direction and in the presence of Maia PlanJoshua G Shaundra Fullam, MD . Electronically Signed: Teofilo PodMatthew P. Jamison, ED Scribe. 10/03/2016. 5:38 PM.   I have reviewed the triage vital signs and the nursing notes.   HISTORY  Chief Complaint Fever   HPI Kristen Wallace is a 82 y.o. female with PMHx of HTN, lupus, and DM who presents to the Emergency Department via EMS complaining of constant fever that began today. Pt complains of associated decreased urination and cough. Per daughter, pt began to have associated chills and nausea early this morning. Daughter checked pt's temperature at noon today and it was 103. Daughter states that pt urinated 4 times today, but it was a very small amount of dark urine. Pt also reports lower abdominal pain on the right side that radiates to her back. Pt has a colostomy bag on her left side that was placed 18 years ago, and states that her bowel have been moving very well. Pt notes a hx of a torn right bicep 3 months ago. Pt was given 1g tylenol by EMS at 1645 and 200cc bolus of NS en route with mild relief.  Pt denies other associated symptoms.     Past Medical History:  Diagnosis Date  . Arthritis    oa  . Back pain, chronic   . Blood transfusion without reported diagnosis   . Chronic renal insufficiency   . Diabetes mellitus without complication (HCC)   . Hypertension   . Lupus   . Macular degeneration   . SBO (small bowel obstruction) 07/2016  . Spinal stenosis     Patient Active Problem List   Diagnosis Date Noted  . Hernia with obstruction 08/08/2016  . DM2 (diabetes mellitus, type 2) (HCC) 08/08/2016  . HTN (hypertension) 08/08/2016  . SLE (systemic lupus erythematosus) (HCC) 08/08/2016  . SBO (small bowel obstruction) 08/07/2016  . Compression fracture of L3 lumbar vertebra (HCC) 03/02/2016  . Septic shock (HCC)  02/04/2013  . Bacteremia 02/04/2013  . Fever and chills 02/03/2013  . Leukocytosis 02/03/2013  . Hypotension, unspecified 02/03/2013    Past Surgical History:  Procedure Laterality Date  . APPENDECTOMY    . CHOLECYSTECTOMY    . COLOSTOMY    . EYE SURGERY    . KYPHOPLASTY N/A 03/03/2016   Procedure: Lumbar three kyphoplasty ;  Surgeon: Coletta MemosKyle Cabbell, MD;  Location: [facility];  Service: Neurosurgery;  Laterality: N/A;  . TONSILLECTOMY      Current Outpatient Rx  . Order #: [id number] Class: Historical Med  . Order #: [id number] Class: Historical Med  . Order #: [id number] Class: Historical Med  . Order #: [id number] Class: Historical Med  . Order #: [id number] Class: Print  . Order #: [id number] Class: Historical Med  . Order #: [id number] Class: Historical Med  . Order #: [id number] Class: Historical Med  . Order #: [id number] Class: Historical Med  . Order #: [id number] Class: Historical Med  . Order #: [id number] Class: Historical Med    Allergies Betadine [povidone iodine]  No family history on file.  Social History Social History  Substance Use Topics  . Smoking status: Never Smoker  . Smokeless tobacco: Never Used  . Alcohol use No    Review of Systems  Constitutional:Positive for fever/chills Eyes: No visual changes. ENT: No sore throat. Cardiovascular: Denies chest pain. Respiratory: Positive for cough. Denies shortness of breath. Gastrointestinal: Positive for nausea. Positive for abdominal pain.  No vomiting.  No diarrhea.  No constipation. Genitourinary: Positive for decreased urination. Negative for dysuria. Musculoskeletal: Negative for back pain. Skin: Negative for rash. Neurological: Negative for headaches, focal weakness or numbness.  10-point ROS otherwise negative.  ____________________________________________   PHYSICAL EXAM:  VITAL SIGNS: Temp: 99.8 F Resp: 16 SpO2: 96% Pulse: 74 BP: 121/61  Constitutional: Alert and oriented. Well appearing and in no acute  distress. Patient is conversational.  Eyes: Conjunctivae are normal. PERRL. EOMI. Head: Atraumatic. Nose: No congestion/rhinnorhea. Mouth/Throat: Mucous membranes are slightly dry. Oropharynx non-erythematous. Neck: No stridor.  Cardiovascular: Normal rate, regular rhythm. Good peripheral circulation. Grossly normal heart sounds.   Respiratory: Normal respiratory effort.  No retractions. Lungs CTAB. Gastrointestinal: Soft and nontender. No distention. Colostomy bag in LLQ.  Musculoskeletal: No lower extremity tenderness nor edema. No gross deformities of extremities. Neurologic:  Normal speech and language. No gross focal neurologic deficits are appreciated.  Skin:  Skin is warm, dry and intact. No rash noted.  ____________________________________________   LABS (all labs ordered are listed, but only abnormal results are displayed)  Labs Reviewed  COMPREHENSIVE METABOLIC PANEL - Abnormal; Notable for the following:       Result Value   Chloride 99 (*)    Glucose, Bld 124 (*)    BUN 28 (*)    Creatinine, Ser 1.11 (*)    Calcium 8.6 (*)    Total Protein 5.2 (*)    Albumin 3.0 (*)    Total Bilirubin 1.9 (*)    GFR calc non Af Amer 43 (*)    GFR calc Af Amer 50 (*)    All other components within normal limits  CBC WITH DIFFERENTIAL/PLATELET - Abnormal; Notable for the following:    WBC 14.8 (*)    RBC 3.29 (*)    Hemoglobin 11.5 (*)    HCT 34.2 (*)    MCV 104.0 (*)    MCH 35.0 (*)    Platelets 142 (*)    Neutro Abs 12.0 (*)    Monocytes Absolute 1.2 (*)    All other components within normal limits  URINALYSIS, ROUTINE W REFLEX MICROSCOPIC (NOT AT [facility]) - Abnormal; Notable for the following:    Color, Urine AMBER (*)    APPearance CLOUDY (*)    Leukocytes, UA MODERATE (*)    All other components within normal limits  URINE MICROSCOPIC-ADD ON - Abnormal; Notable for the following:    Squamous Epithelial / LPF 0-5 (*)    Bacteria, UA MANY (*)    All other components within  normal limits  CULTURE, BLOOD (SINGLE)  LIPASE, BLOOD  I-STAT CG4 LACTIC ACID, ED   ____________________________________________  EKG   EKG Interpretation  Date/Time:  Monday October 03 2016 17:49:59 EST Ventricular Rate:  62 PR Interval:    QRS Duration: 147 QT Interval:  480 QTC Calculation: 488 R Axis:   -48 Text Interpretation:  Sinus rhythm RBBB and LAFB LVH with secondary repolarization abnormality Lateral infarct, age indeterminate No STEMI. Compared to prior.  Confirmed by Melita Villalona MD, Willys Salvino [phone]) on 10/03/2016 6:41:00 PM      ____________________________________________  RADIOLOGY  Dg Chest 2 View  Result Date: 10/03/2016 CLINICAL DATA:  Fever today. EXAM: CHEST  2 VIEW COMPARISON:  PA and lateral chest 08/07/2016 and 02/02/2013. FINDINGS: There is unchanged elevation of the right hemidiaphragm relative to the left. Lungs are clear. Heart size is normal. No pneumothorax or pleural effusion. Aortic atherosclerosis is noted. IMPRESSION: No acute disease. Electronically Signed  By: Drusilla Kanner M.D.   On: 10/03/2016 18:09    ____________________________________________   PROCEDURES  Procedure(s) performed:   Procedures  None ____________________________________________   INITIAL IMPRESSION / ASSESSMENT AND PLAN / ED COURSE  Pertinent labs & imaging results that were available during my care of the patient were reviewed by me and considered in my medical decision making (see chart for details).  Patient resents emergency department for evaluation of lower abdominal discomfort and unusual sleepiness today. She had fevers at home. Family at bedside states that since arrival to the emergency department she is much more awake and alert. They do not appreciate focal neurological deficit or obvious seizure activity. Plan for labs, cultures, chest x-ray. No focal findings on my exam. The patient's abdomen is soft and nontender. She is having output into her  colostomy.   07:54 PM The patient has evidence of a urinary tract infection with associated leukocytosis. Her lactate is normal. And has been awake and alert here in the emergency department. I spoke with both daughters were at the bedside who would prefer to avoid a hospitalization. The patient had transient hypotension here in the emergency Department which may have been a spurious reading as the patient has continued to look clinically very well with largely normal blood pressures. I treated her with IV Rocephin and gave a small amount of fluid. I offered observational admission to the family and patient but they feel that she looks much better and would like to have a trial of oral antibiotics at home which seems appropriate. We discussed return precautions in detail. The daughters are very engaged, knowledgeable, and attentive. Plan for discharge at this time.    ____________________________________________  FINAL CLINICAL IMPRESSION(S) / ED DIAGNOSES  Final diagnoses:  Urinary tract infection without hematuria, site unspecified     MEDICATIONS GIVEN DURING THIS VISIT:  Medications  sodium chloride 0.9 % bolus 500 mL (0 mLs Intravenous Stopped 10/03/16 2003)  cefTRIAXone (ROCEPHIN) 1 g in dextrose 5 % 50 mL IVPB (0 g Intravenous Stopped 10/03/16 1955)     NEW OUTPATIENT MEDICATIONS STARTED DURING THIS VISIT:  Discharge Medication List as of 10/03/2016  7:58 PM    START taking these medications   Details  levofloxacin (LEVAQUIN) 750 MG tablet Take 1 tablet (750 mg total) by mouth daily., Starting Mon 10/03/2016, Until Sat 10/08/2016, Print        Note:  This document was prepared using Dragon voice recognition software and may include unintentional dictation errors.  Alona Bene, MD Emergency Medicine  I personally performed the services described in this documentation, which was scribed in my presence. The recorded information has been reviewed and is accurate.         Maia Plan, MD 10/03/16 2255

## 2016-10-03 NOTE — Discharge Instructions (Signed)
You have been seen in the Emergency Department (ED) today for pain when urinating and sleepiness.  Your workup today suggests that you have a urinary tract infection (UTI).  Please take your antibiotic as prescribed and over-the-counter pain medication (Tylenol or Motrin) as needed, but no more than recommended on the label instructions.  Drink PLENTY of fluids.  Call your regular doctor to schedule the next available appointment to follow up on today?s ED visit, or return immediately to the ED if your pain worsens, you have decreased urine production, develop fever, persistent vomiting, or other symptoms that concern you.

## 2016-10-03 NOTE — ED Triage Notes (Addendum)
C/o fever and decreased urine out put. Pt given 1 gram tylenol by EMSat 1645 and 200cc bolus of NS enroute. Pt also has hx of torn right bicep 3 months ago.

## 2016-10-08 LAB — CULTURE, BLOOD (SINGLE): Culture: NO GROWTH

## 2018-11-02 ENCOUNTER — Encounter (HOSPITAL_BASED_OUTPATIENT_CLINIC_OR_DEPARTMENT_OTHER): Payer: Medicare Other | Attending: Internal Medicine

## 2018-11-02 DIAGNOSIS — E1140 Type 2 diabetes mellitus with diabetic neuropathy, unspecified: Secondary | ICD-10-CM | POA: Insufficient documentation

## 2018-11-02 DIAGNOSIS — L89151 Pressure ulcer of sacral region, stage 1: Secondary | ICD-10-CM | POA: Insufficient documentation

## 2018-11-02 DIAGNOSIS — L97211 Non-pressure chronic ulcer of right calf limited to breakdown of skin: Secondary | ICD-10-CM | POA: Insufficient documentation

## 2018-11-02 DIAGNOSIS — E1136 Type 2 diabetes mellitus with diabetic cataract: Secondary | ICD-10-CM | POA: Diagnosis not present

## 2018-11-02 DIAGNOSIS — I10 Essential (primary) hypertension: Secondary | ICD-10-CM | POA: Diagnosis not present

## 2018-11-02 DIAGNOSIS — Z933 Colostomy status: Secondary | ICD-10-CM | POA: Diagnosis not present

## 2018-11-02 DIAGNOSIS — I890 Lymphedema, not elsewhere classified: Secondary | ICD-10-CM | POA: Diagnosis not present

## 2018-11-02 DIAGNOSIS — L97221 Non-pressure chronic ulcer of left calf limited to breakdown of skin: Secondary | ICD-10-CM | POA: Insufficient documentation

## 2018-11-09 DIAGNOSIS — L97221 Non-pressure chronic ulcer of left calf limited to breakdown of skin: Secondary | ICD-10-CM | POA: Diagnosis not present

## 2018-11-16 DIAGNOSIS — L97221 Non-pressure chronic ulcer of left calf limited to breakdown of skin: Secondary | ICD-10-CM | POA: Diagnosis not present

## 2018-11-23 DIAGNOSIS — L97221 Non-pressure chronic ulcer of left calf limited to breakdown of skin: Secondary | ICD-10-CM | POA: Diagnosis not present

## 2019-01-24 ENCOUNTER — Other Ambulatory Visit: Payer: Self-pay

## 2019-01-24 ENCOUNTER — Emergency Department (HOSPITAL_BASED_OUTPATIENT_CLINIC_OR_DEPARTMENT_OTHER)
Admission: EM | Admit: 2019-01-24 | Discharge: 2019-01-25 | Disposition: A | Discharge status: Home / Self Care | Payer: Medicare Other | Attending: Emergency Medicine | Admitting: Emergency Medicine

## 2019-01-24 ENCOUNTER — Emergency Department (HOSPITAL_BASED_OUTPATIENT_CLINIC_OR_DEPARTMENT_OTHER): Payer: Medicare Other

## 2019-01-24 ENCOUNTER — Encounter (HOSPITAL_BASED_OUTPATIENT_CLINIC_OR_DEPARTMENT_OTHER): Payer: Self-pay | Admitting: Emergency Medicine

## 2019-01-24 DIAGNOSIS — Y999 Unspecified external cause status: Secondary | ICD-10-CM | POA: Insufficient documentation

## 2019-01-24 DIAGNOSIS — S8992XA Unspecified injury of left lower leg, initial encounter: Secondary | ICD-10-CM | POA: Diagnosis present

## 2019-01-24 DIAGNOSIS — N189 Chronic kidney disease, unspecified: Secondary | ICD-10-CM | POA: Diagnosis not present

## 2019-01-24 DIAGNOSIS — Y92009 Unspecified place in unspecified non-institutional (private) residence as the place of occurrence of the external cause: Secondary | ICD-10-CM | POA: Diagnosis not present

## 2019-01-24 DIAGNOSIS — Y939 Activity, unspecified: Secondary | ICD-10-CM | POA: Diagnosis not present

## 2019-01-24 DIAGNOSIS — Z79899 Other long term (current) drug therapy: Secondary | ICD-10-CM | POA: Insufficient documentation

## 2019-01-24 DIAGNOSIS — W2209XA Striking against other stationary object, initial encounter: Secondary | ICD-10-CM | POA: Insufficient documentation

## 2019-01-24 DIAGNOSIS — S81812A Laceration without foreign body, left lower leg, initial encounter: Secondary | ICD-10-CM | POA: Insufficient documentation

## 2019-01-24 DIAGNOSIS — E1122 Type 2 diabetes mellitus with diabetic chronic kidney disease: Secondary | ICD-10-CM | POA: Diagnosis not present

## 2019-01-24 DIAGNOSIS — Z23 Encounter for immunization: Secondary | ICD-10-CM | POA: Insufficient documentation

## 2019-01-24 DIAGNOSIS — I129 Hypertensive chronic kidney disease with stage 1 through stage 4 chronic kidney disease, or unspecified chronic kidney disease: Secondary | ICD-10-CM | POA: Insufficient documentation

## 2019-01-24 MED ORDER — CEPHALEXIN 250 MG PO CAPS
500.0000 mg | ORAL_CAPSULE | Freq: Once | ORAL | Status: AC
  Administered 2019-01-24: 500 mg via ORAL
  Filled 2019-01-24: qty 2

## 2019-01-24 MED ORDER — TETANUS-DIPHTH-ACELL PERTUSSIS 5-2.5-18.5 LF-MCG/0.5 IM SUSP
0.5000 mL | Freq: Once | INTRAMUSCULAR | Status: AC
  Administered 2019-01-24: 0.5 mL via INTRAMUSCULAR
  Filled 2019-01-24: qty 0.5

## 2019-01-24 NOTE — ED Provider Notes (Signed)
[facility] Provider Note: Lowella Dell, MD, FACEP  CSN: [id number] MRN: [id number] ARRIVAL: 01/24/19 at 2253 ROOM: [facility]08/[facility]08   CHIEF COMPLAINT  Leg Injury   HISTORY OF PRESENT ILLNESS  01/24/19 11:18 PM Kristen Wallace is a 85 y.o. female who struck her left leg against her wheelchair earlier this evening.  She has a skin tear of the left medial shin.  There was profuse bleeding and oozing of serous fluid earlier but this is been controlled with pressure.  She rates her pain as a 4 out of 10, worse with palpation of the wound.  Her tetanus status is unknown.  She has an old injury to her left fifth toe.    Past Medical History:  Diagnosis Date  . Arthritis    oa  . Back pain, chronic   . Blood transfusion without reported diagnosis   . Chronic renal insufficiency   . Diabetes mellitus without complication (HCC)   . Hypertension   . Lupus (HCC)   . Macular degeneration   . SBO (small bowel obstruction) (HCC) 07/2016  . Spinal stenosis     Past Surgical History:  Procedure Laterality Date  . APPENDECTOMY    . BACK SURGERY    . CHOLECYSTECTOMY    . COLOSTOMY    . EYE SURGERY    . KYPHOPLASTY N/A 03/03/2016   Procedure: Lumbar three kyphoplasty ;  Surgeon: Coletta Memos, MD;  Location: [facility];  Service: Neurosurgery;  Laterality: N/A;  . TONSILLECTOMY      History reviewed. No pertinent family history.  Social History   Tobacco Use  . Smoking status: Never Smoker  . Smokeless tobacco: Never Used  Substance Use Topics  . Alcohol use: No  . Drug use: No    Prior to Admission medications   Medication Sig Start Date End Date Taking? Authorizing Provider  atenolol (TENORMIN) 25 MG tablet Take 12.5 mg by mouth daily.    [provider]  darifenacin (ENABLEX) 15 MG 24 hr tablet Take 15 mg by mouth daily.    [provider]  furosemide (LASIX) 40 MG tablet Take 40 mg by mouth daily.    [provider]  Hypromellose (ARTIFICIAL  TEARS OP) Place 1 drop into both eyes 3 (three) times daily as needed (dry eyes).    [provider]  lidocaine (LIDODERM) 5 % APPLY 1 PATCH TO INTACT SKIN REMOVE AFTER 12 HOURS ONCE A DAY EXTERNALLY 02/10/16   [provider]  omeprazole (PRILOSEC) 20 MG capsule Take 20 mg by mouth daily.    [provider]  oxyCODONE-acetaminophen (PERCOCET) 10-325 MG tablet Take 1 tablet by mouth every 4 (four) hours as needed for pain.  02/15/16   [provider]  predniSONE (DELTASONE) 5 MG tablet Take 7.5 mg by mouth daily. 02/12/16   [provider]  Probiotic Product (PROBIOTIC PO) Take 1 capsule by mouth daily.    [provider]  telmisartan (MICARDIS) 40 MG tablet Take 40 mg by mouth daily.    [provider]    Allergies Betadine [povidone iodine]   REVIEW OF SYSTEMS  Negative except as noted here or in the History of Present Illness.   PHYSICAL EXAMINATION  Initial Vital Signs Blood pressure (!) 160/70, pulse 79, temperature 97.8 F (36.6 C), temperature source Oral, resp. rate 16, height 5\' 5"  (1.651 m), weight 59 kg, SpO2 100 %.  Examination General: Well-developed, well-nourished female in no acute distress; appearance consistent with age  of record HENT: normocephalic; atraumatic Eyes: pupils equal, round and reactive to light; extraocular muscles grossly intact Neck: supple Heart: regular rate and rhythm Lungs: clear to auscultation bilaterally Abdomen: soft; nondistended; nontender; bowel sounds present Extremities: Arthritic changes; 2+ pitting edema of lower legs and feet; skin tear left medial shin; ecchymosis of left fifth toe without tenderness Neurologic: Awake, alert; motor function intact in all extremities and symmetric; no facial droop; hard of hearing Skin: Warm and dry Psychiatric: Normal mood and affect   RESULTS  Summary of this visit's results, reviewed by myself:   EKG Interpretation  Date/Time:      Ventricular Rate:    PR Interval:    QRS Duration:   QT Interval:    QTC Calculation:   R Axis:     Text Interpretation:        Laboratory Studies: No results found for this or any previous visit (from the past 24 hour(s)). Imaging Studies: Dg Tibia/fibula Left  Result Date: 01/25/2019 CLINICAL DATA:  Injury and pain to the left lower leg. Skin tear. Swelling. EXAM: LEFT TIBIA AND FIBULA - 2 VIEW COMPARISON:  None. FINDINGS: Diffuse bone demineralization. Degenerative changes in the left knee. No evidence of acute fracture or dislocation of the left tibia or fibula. Prominent vascular calcifications. IMPRESSION: No acute bony abnormalities. Electronically Signed   By: Burman Nieves M.D.   On: 01/25/2019 00:32    ED COURSE and MDM  Nursing notes and initial vitals signs, including pulse oximetry, reviewed.  Vitals:   01/24/19 2300 01/24/19 2301  BP:  (!) 160/70  Pulse:  79  Resp:  16  Temp:  97.8 F (36.6 C)  TempSrc:  Oral  SpO2:  100%  Weight: 59 kg   Height: 5\' 5"  (1.651 m)    Skin tear bandaged by nursing staff.  Will place patient on antibiotics given edema and poor circulation in lower legs.  PROCEDURES    ED DIAGNOSES     ICD-10-CM   1. Skin tear of left lower leg without complication, initial encounter K16.010X        Paula Libra, MD 01/25/19 [phone]

## 2019-01-24 NOTE — ED Triage Notes (Signed)
Pt bumped her left lower leg tonight  Daughter states when she arrived there was blood on the bed and floor  States she has a skin tear noted to her leg  Daughter applied a dressing  Bleeding controlled at this time  Pt has swelling noted to her leg and foot

## 2019-01-25 MED ORDER — CEPHALEXIN 500 MG PO CAPS: 500.0000 mg | ORAL_CAPSULE | Freq: Four times a day (QID) | ORAL | 0 refills | Status: AC

## 2019-01-25 NOTE — ED Notes (Signed)
Dressing saturated with pink tinged watery drainage; dressed changed. New dressing clean dry and intact; footies applied.

## 2019-07-19 IMAGING — DX DG TIBIA/FIBULA 2V*L*
3 series · 3 of 3 positions shown · non-contrast
Comparison: None.

CLINICAL DATA: Injury and pain to the left lower leg. Skin tear.
Swelling.

EXAM:
LEFT TIBIA AND FIBULA - 2 VIEW

[tibia ap]
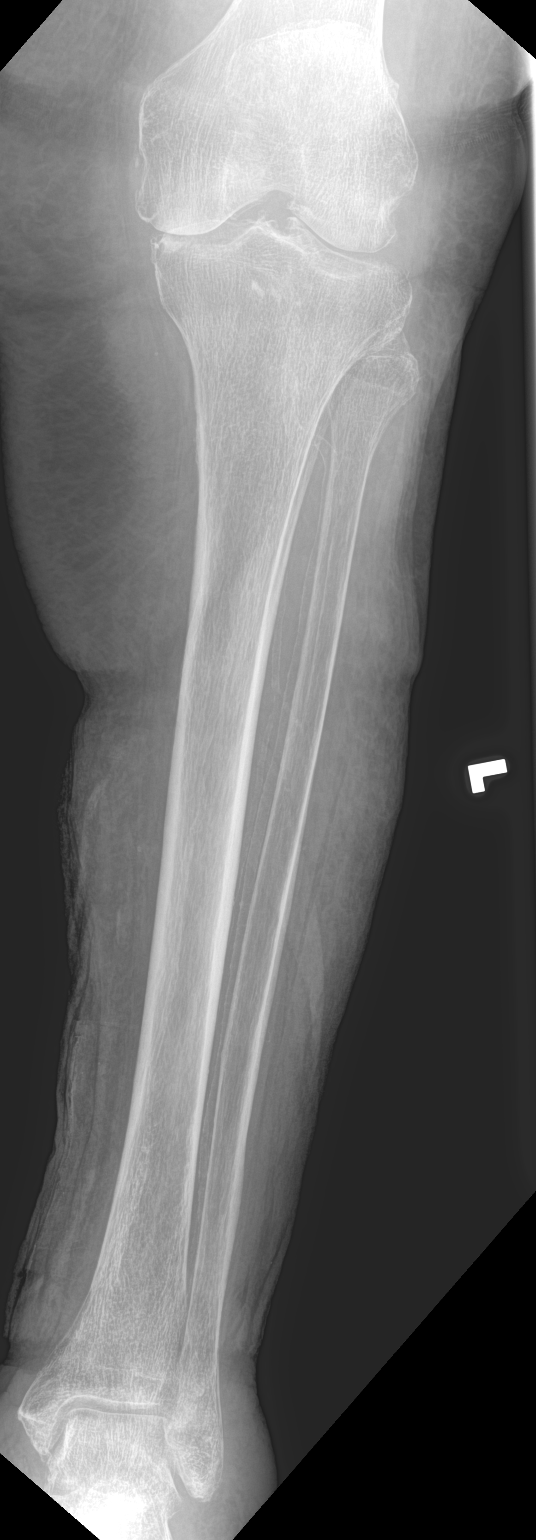

[tibia lat (1 of 2)]
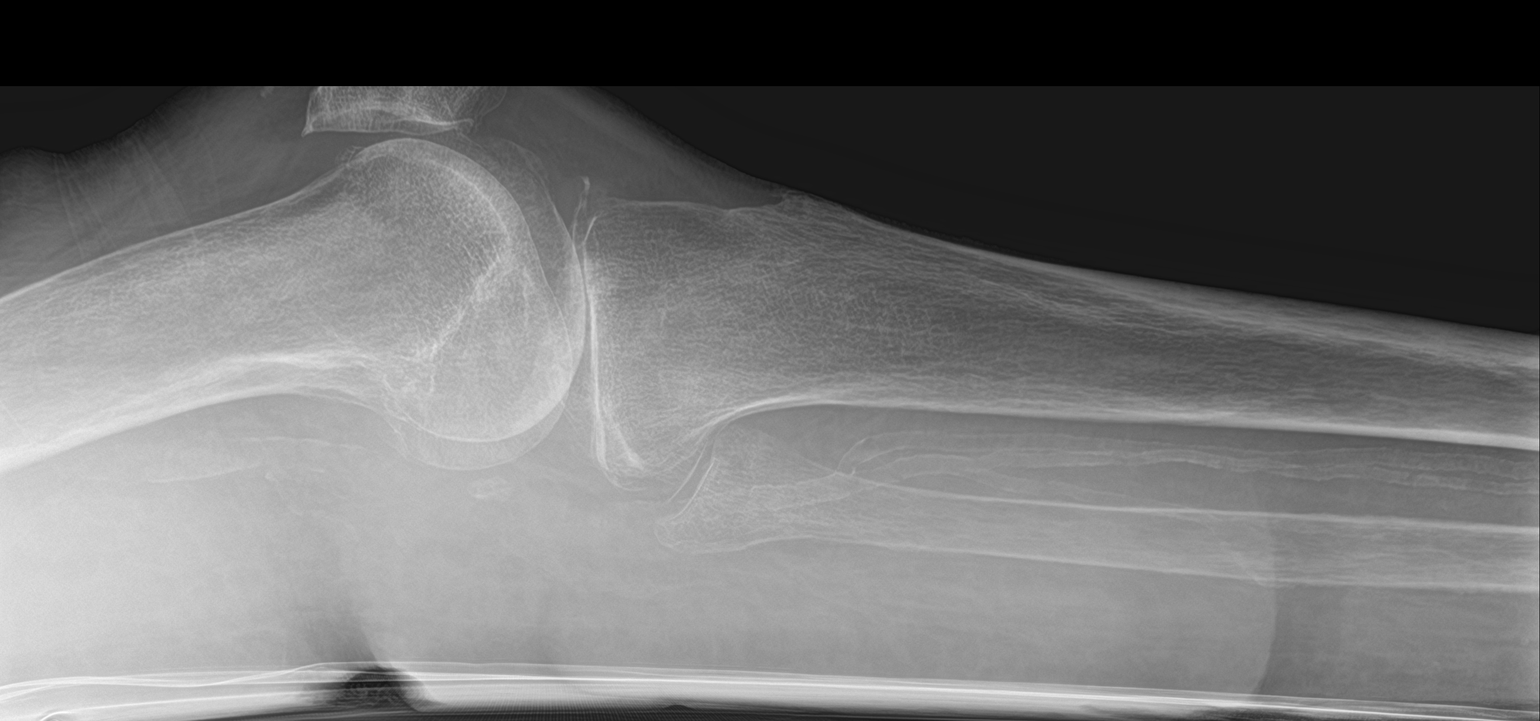

[tibia lat (2 of 2)]
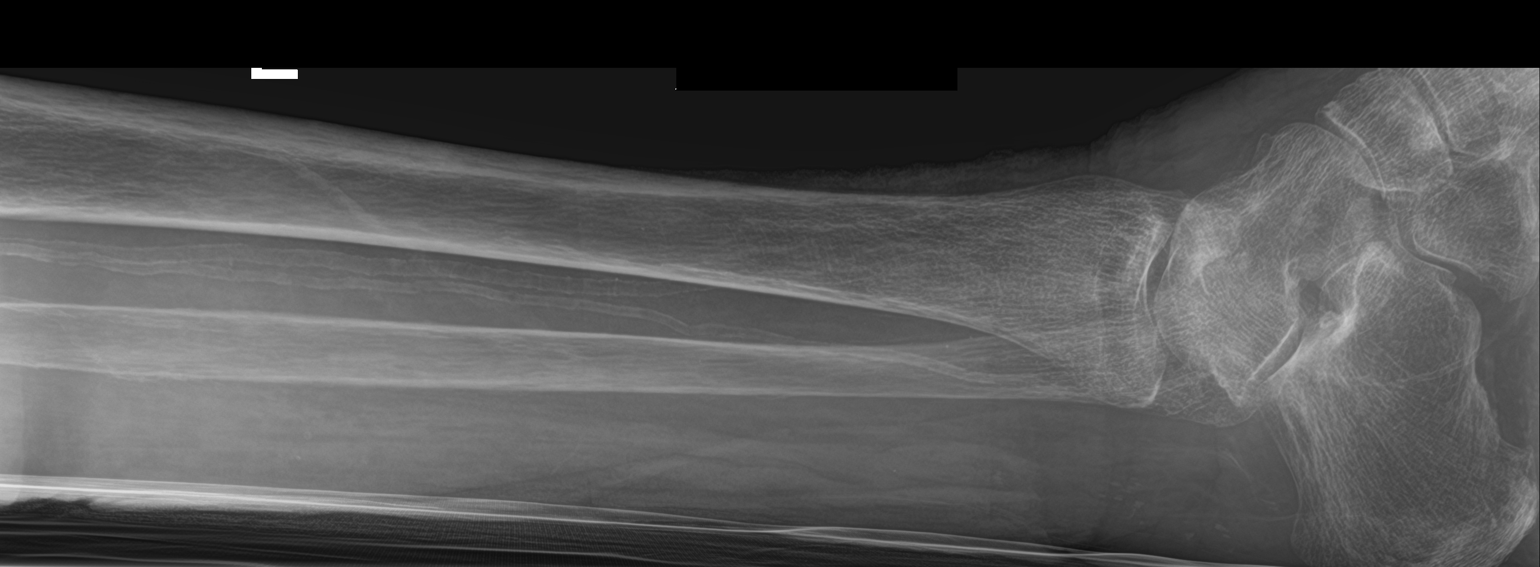

[3 of 3 positions shown; findings below may reference images not displayed]

FINDINGS: Diffuse bone demineralization. Degenerative changes in the left
knee. No evidence of acute fracture or dislocation of the left tibia
or fibula. Prominent vascular calcifications.
IMPRESSION: No acute bony abnormalities.

## 2019-07-30 DEATH — deceased

## 2022-08-30 ENCOUNTER — Other Ambulatory Visit: Payer: Self-pay | Admitting: *Deleted

## 2022-08-30 NOTE — Telephone Encounter (Signed)
Erroneous encounter opened.
